# Patient Record
Sex: Male | Born: 1955
Health system: Southern US, Community
[De-identification: ages and names within clinical notes are randomized; demographics above are authoritative.]

## PROBLEM LIST (undated history)

## (undated) DIAGNOSIS — K429 Umbilical hernia without obstruction or gangrene: Secondary | ICD-10-CM

## (undated) DIAGNOSIS — I2510 Atherosclerotic heart disease of native coronary artery without angina pectoris: Secondary | ICD-10-CM

## (undated) DIAGNOSIS — I10 Essential (primary) hypertension: Secondary | ICD-10-CM

## (undated) DIAGNOSIS — M1990 Unspecified osteoarthritis, unspecified site: Secondary | ICD-10-CM

## (undated) DIAGNOSIS — E785 Hyperlipidemia, unspecified: Secondary | ICD-10-CM

## (undated) HISTORY — DX: Hyperlipidemia, unspecified: E78.5

## (undated) HISTORY — DX: Umbilical hernia without obstruction or gangrene: K42.9

## (undated) HISTORY — PX: COLONOSCOPY: SHX174

## (undated) HISTORY — PX: KNEE SURGERY: SHX244

## (undated) HISTORY — DX: Essential (primary) hypertension: I10

## (undated) HISTORY — DX: Atherosclerotic heart disease of native coronary artery without angina pectoris: I25.10

---

## 2011-06-24 ENCOUNTER — Ambulatory Visit (INDEPENDENT_AMBULATORY_CARE_PROVIDER_SITE_OTHER): Payer: BC Managed Care – PPO

## 2011-06-24 DIAGNOSIS — R05 Cough: Secondary | ICD-10-CM

## 2011-06-24 DIAGNOSIS — J029 Acute pharyngitis, unspecified: Secondary | ICD-10-CM

## 2011-06-24 DIAGNOSIS — R059 Cough, unspecified: Secondary | ICD-10-CM

## 2011-06-24 DIAGNOSIS — J019 Acute sinusitis, unspecified: Secondary | ICD-10-CM

## 2015-03-25 ENCOUNTER — Encounter (HOSPITAL_COMMUNITY): Payer: Self-pay | Admitting: Emergency Medicine

## 2015-03-25 ENCOUNTER — Emergency Department (HOSPITAL_COMMUNITY): Payer: BLUE CROSS/BLUE SHIELD

## 2015-03-25 ENCOUNTER — Emergency Department (HOSPITAL_COMMUNITY)
Admission: EM | Admit: 2015-03-25 | Discharge: 2015-03-25 | Disposition: A | Discharge status: Home / Self Care | Payer: BLUE CROSS/BLUE SHIELD | Attending: Emergency Medicine | Admitting: Emergency Medicine

## 2015-03-25 DIAGNOSIS — Z79899 Other long term (current) drug therapy: Secondary | ICD-10-CM | POA: Diagnosis not present

## 2015-03-25 DIAGNOSIS — R109 Unspecified abdominal pain: Secondary | ICD-10-CM

## 2015-03-25 DIAGNOSIS — N200 Calculus of kidney: Secondary | ICD-10-CM | POA: Insufficient documentation

## 2015-03-25 LAB — CBC WITH DIFFERENTIAL/PLATELET
Basophils Absolute: 0 10*3/uL (ref 0.0–0.1)
Basophils Relative: 0 %
EOS ABS: 0.2 10*3/uL (ref 0.0–0.7)
EOS PCT: 3 %
HCT: 46.3 % (ref 39.0–52.0)
HEMOGLOBIN: 16.3 g/dL (ref 13.0–17.0)
LYMPHS ABS: 2.1 10*3/uL (ref 0.7–4.0)
LYMPHS PCT: 31 %
MCH: 33.1 pg (ref 26.0–34.0)
MCHC: 35.2 g/dL (ref 30.0–36.0)
MCV: 94.1 fL (ref 78.0–100.0)
MONOS PCT: 7 %
Monocytes Absolute: 0.5 10*3/uL (ref 0.1–1.0)
NEUTROS PCT: 59 %
Neutro Abs: 4 10*3/uL (ref 1.7–7.7)
Platelets: 242 10*3/uL (ref 150–400)
RBC: 4.92 MIL/uL (ref 4.22–5.81)
RDW: 12.5 % (ref 11.5–15.5)
WBC: 6.8 10*3/uL (ref 4.0–10.5)

## 2015-03-25 LAB — COMPREHENSIVE METABOLIC PANEL
ALK PHOS: 59 U/L (ref 38–126)
ALT: 31 U/L (ref 17–63)
ANION GAP: 10 (ref 5–15)
AST: 40 U/L (ref 15–41)
Albumin: 4.5 g/dL (ref 3.5–5.0)
BUN: 21 mg/dL — ABNORMAL HIGH (ref 6–20)
CALCIUM: 9.7 mg/dL (ref 8.9–10.3)
CO2: 22 mmol/L (ref 22–32)
CREATININE: 1.06 mg/dL (ref 0.61–1.24)
Chloride: 109 mmol/L (ref 101–111)
Glucose, Bld: 103 mg/dL — ABNORMAL HIGH (ref 65–99)
Potassium: 4 mmol/L (ref 3.5–5.1)
SODIUM: 141 mmol/L (ref 135–145)
TOTAL PROTEIN: 7.9 g/dL (ref 6.5–8.1)
Total Bilirubin: 0.9 mg/dL (ref 0.3–1.2)

## 2015-03-25 LAB — URINALYSIS, ROUTINE W REFLEX MICROSCOPIC
BILIRUBIN URINE: NEGATIVE
Glucose, UA: NEGATIVE mg/dL
KETONES UR: NEGATIVE mg/dL
Leukocytes, UA: NEGATIVE
NITRITE: NEGATIVE
PROTEIN: 30 mg/dL — AB
SPECIFIC GRAVITY, URINE: 1.027 (ref 1.005–1.030)
UROBILINOGEN UA: 0.2 mg/dL (ref 0.0–1.0)
pH: 5.5 (ref 5.0–8.0)

## 2015-03-25 LAB — URINE MICROSCOPIC-ADD ON

## 2015-03-25 MED ORDER — HYDROCODONE-ACETAMINOPHEN 5-325 MG PO TABS
2.0000 | ORAL_TABLET | ORAL | Status: DC | PRN
Start: 2015-03-25 — End: 2021-08-13

## 2015-03-25 MED ORDER — HYDROMORPHONE HCL 1 MG/ML IJ SOLN
1.0000 mg | Freq: Once | INTRAMUSCULAR | Status: AC
Start: 2015-03-25 — End: 2015-03-25
  Administered 2015-03-25: 1 mg via INTRAVENOUS
  Filled 2015-03-25: qty 1

## 2015-03-25 MED ORDER — HYDROMORPHONE HCL 1 MG/ML IJ SOLN
1.0000 mg | Freq: Once | INTRAMUSCULAR | Status: AC
  Administered 2015-03-25: 1 mg via INTRAVENOUS
  Filled 2015-03-25: qty 1

## 2015-03-25 MED ORDER — TAMSULOSIN HCL 0.4 MG PO CAPS: 0.4000 mg | ORAL_CAPSULE | Freq: Every day | ORAL | Status: DC

## 2015-03-25 MED ORDER — NAPROXEN 500 MG PO TABS: 500.0000 mg | ORAL_TABLET | Freq: Three times a day (TID) | ORAL | Status: DC

## 2015-03-25 MED ORDER — KETOROLAC TROMETHAMINE 30 MG/ML IJ SOLN
30.0000 mg | Freq: Once | INTRAMUSCULAR | Status: DC
  Filled 2015-03-25: qty 1

## 2015-03-25 MED ORDER — KETOROLAC TROMETHAMINE 30 MG/ML IJ SOLN
30.0000 mg | Freq: Once | INTRAMUSCULAR | Status: AC
  Administered 2015-03-25: 30 mg via INTRAVENOUS

## 2015-03-25 MED ORDER — ONDANSETRON 4 MG PO TBDP: 4.0000 mg | ORAL_TABLET | Freq: Three times a day (TID) | ORAL | Status: DC | PRN

## 2015-03-25 MED ORDER — ONDANSETRON HCL 4 MG/2ML IJ SOLN
4.0000 mg | Freq: Once | INTRAMUSCULAR | Status: AC
  Administered 2015-03-25: 4 mg via INTRAVENOUS
  Filled 2015-03-25: qty 2

## 2015-03-25 MED ORDER — SODIUM CHLORIDE 0.9 % IV BOLUS (SEPSIS)
1000.0000 mL | Freq: Once | INTRAVENOUS | Status: AC
  Administered 2015-03-25: 1000 mL via INTRAVENOUS

## 2015-03-25 NOTE — ED Provider Notes (Signed)
CSN: [id number]     Arrival date & time 03/25/15  1748 History   First MD Initiated Contact with Patient 03/25/15 1806     Chief Complaint  Patient presents with  . Abdominal Pain     (Consider location/radiation/quality/duration/timing/severity/associated sxs/prior Treatment) Patient is a 59 y.o. male presenting with flank pain.  Flank Pain This is a new problem. The current episode started 3 to 5 hours ago. The problem occurs constantly. The problem has not changed since onset.Associated symptoms include abdominal pain. Pertinent negatives include no chest pain, no headaches and no shortness of breath. Nothing aggravates the symptoms. Nothing relieves the symptoms. He has tried nothing for the symptoms. The treatment provided no relief.    History reviewed. No pertinent past medical history. History reviewed. No pertinent past surgical history. No family history on file. Social History  Substance Use Topics  . Smoking status: Never Smoker   . Smokeless tobacco: None  . Alcohol Use: No    Review of Systems  Constitutional: Negative for fever.  HENT: Negative for sore throat.   Eyes: Negative for visual disturbance.  Respiratory: Negative for shortness of breath.   Cardiovascular: Negative for chest pain.  Gastrointestinal: Positive for nausea and abdominal pain. Negative for vomiting, diarrhea and constipation.  Genitourinary: Positive for flank pain. Negative for difficulty urinating.  Musculoskeletal: Negative for back pain and neck stiffness.  Skin: Negative for rash.  Neurological: Negative for syncope and headaches.      Allergies  Review of patient's allergies indicates no known allergies.  Home Medications   Prior to Admission medications   Medication Sig Start Date End Date Taking? Authorizing Provider  calcium carbonate (TUMS - DOSED IN MG ELEMENTAL CALCIUM) 500 MG chewable tablet Chew 1 tablet by mouth 2 (two) times daily as needed for indigestion or  heartburn.   Yes Historical Provider, MD  HYDROcodone-acetaminophen (NORCO/VICODIN) 5-325 MG tablet Take 2 tablets by mouth every 4 (four) hours as needed. 03/25/15   Alvira Monday, MD  naproxen (NAPROSYN) 500 MG tablet Take 1 tablet (500 mg total) by mouth 3 (three) times daily with meals. 03/25/15   Alvira Monday, MD  ondansetron (ZOFRAN ODT) 4 MG disintegrating tablet Take 1 tablet (4 mg total) by mouth every 8 (eight) hours as needed for nausea or vomiting. 03/25/15   Alvira Monday, MD  tamsulosin (FLOMAX) 0.4 MG CAPS capsule Take 1 capsule (0.4 mg total) by mouth daily. 03/25/15   Alvira Monday, MD   BP 122/66 mmHg  Pulse 68  Temp(Src) 98.9 F (37.2 C) (Oral)  Resp 18  SpO2 96% Physical Exam  Constitutional: He is oriented to person, place, and time. He appears well-developed and well-nourished. He appears distressed (pain).  HENT:  Head: Normocephalic and atraumatic.  Eyes: Conjunctivae and EOM are normal.  Neck: Normal range of motion.  Cardiovascular: Normal rate, regular rhythm, normal heart sounds and intact distal pulses.  Exam reveals no gallop and no friction rub.   No murmur heard. Pulmonary/Chest: Effort normal and breath sounds normal. No respiratory distress. He has no wheezes. He has no rales.  Abdominal: Soft. He exhibits no distension. There is tenderness. There is CVA tenderness. There is no guarding, no tenderness at McBurney's point and negative Murphy's sign.  Musculoskeletal: He exhibits no edema.  Neurological: He is alert and oriented to person, place, and time.  Skin: Skin is warm and dry. He is not diaphoretic.  Nursing note and vitals reviewed.   ED Course  Procedures (including critical  care time) Labs Review Labs Reviewed  COMPREHENSIVE METABOLIC PANEL - Abnormal; Notable for the following:    Glucose, Bld 103 (*)    BUN 21 (*)    All other components within normal limits  URINALYSIS, ROUTINE W REFLEX MICROSCOPIC (NOT AT [facility]) - Abnormal;  Notable for the following:    Color, Urine AMBER (*)    APPearance TURBID (*)    Hgb urine dipstick LARGE (*)    Protein, ur 30 (*)    All other components within normal limits  URINE MICROSCOPIC-ADD ON - Abnormal; Notable for the following:    Bacteria, UA FEW (*)    Crystals CA OXALATE CRYSTALS (*)    All other components within normal limits  URINE CULTURE  CBC WITH DIFFERENTIAL/PLATELET    Imaging Review Ct Renal Stone Study  03/25/2015   CLINICAL DATA:  Patient with severe abdominal pain within the right flank.  EXAM: CT ABDOMEN AND PELVIS WITHOUT CONTRAST  TECHNIQUE: Multidetector CT imaging of the abdomen and pelvis was performed following the standard protocol without IV contrast.  COMPARISON:  None.  FINDINGS: Lower chest: Normal heart size. No consolidative or nodular pulmonary opacities. No pleural effusion.  Hepatobiliary: Multiple low-attenuation lesions are demonstrated within the liver, too small to characterize however statistically likely represent small cysts. There a few scattered calcifications throughout the liver. Calcified stones layering within the gallbladder lumen. No gallbladder wall thickening. No intrahepatic or extrahepatic biliary ductal dilatation.  Pancreas: Unremarkable  Spleen:  Unremarkable.  Adrenals/Urinary Tract: The adrenal glands are normal. There is a 4 mm nonobstructing stone within the inferior pole of the left kidney. There is mild right hydroureteronephrosis to the level of the distal ureter (image 72; series 2) with there is an obstructing 4 mm stone. Urinary bladder is decompressed. There is peripheral high attenuation within the urinary bladder (image 81; series 2) which is nonspecific and may represent a passed stone or potentially bladder wall calcification.  Stomach/Bowel: The appendix is normal. No abnormal bowel wall thickening or evidence for bowel obstruction. No free fluid or free intraperitoneal air.  Vascular/Lymphatic: Normal caliber  abdominal aorta. No retroperitoneal lymphadenopathy.  Other: Fat containing periumbilical hernia.  Musculoskeletal: No aggressive or acute appearing osseous lesions. Degenerative changes most pronounced L2-3. Additionally there are degenerative changes at T11-12.  IMPRESSION: Obstructing 4 mm stone within the distal right ureter resulting in mild right hydroureteronephrosis.  Nonobstructing 4 mm stone inferior pole left kidney.  Thin high attenuation dependently within the urinary bladder is nonspecific and may represent passed stones or potentially bladder wall calcifications.  Cholelithiasis.   Electronically Signed   By: Annia Belt M.D.   On: 03/25/2015 19:01   I have personally reviewed and evaluated these images and lab results as part of my medical decision-making.   EKG Interpretation None      MDM   Final diagnoses:  Right flank pain  Nephrolithiasis, 4mm right ureteral   60yo male with no significant medical hx presents with concern for right flank pain.  DDx includes perforated viscous, nephrolithiasis, pyelonephritis, cholecystitis.  History, exam, labs, and imaging including CT noncontrast consistent with nephrolithiasis.    patient with obstructing 4 mm stone in the distal right ureter. Was given Toradol, Dilaudid and Zofran for pain with resolution. Given prescription for Flomax, naproxen, Zofran and Norco. Given number for urology follow-up. Discussed need for hydration. Patient discharged in stable condition with understanding of reasons to return.      Alvira Monday, MD 03/27/15 [phone]

## 2015-03-25 NOTE — ED Notes (Signed)
Pt c/o severe abd pain that started suddenly. Pt denies n/v/d, no PMH kidney stones, pt does have appendix.

## 2015-03-25 NOTE — ED Notes (Signed)
Pt is aware of need for urine specimen. 

## 2015-03-27 LAB — URINE CULTURE: Culture: 3000

## 2019-05-19 ENCOUNTER — Other Ambulatory Visit: Payer: Self-pay

## 2019-05-19 DIAGNOSIS — Z20822 Contact with and (suspected) exposure to covid-19: Secondary | ICD-10-CM

## 2019-05-23 LAB — NOVEL CORONAVIRUS, NAA: SARS-CoV-2, NAA: NOT DETECTED

## 2020-11-13 DIAGNOSIS — E785 Hyperlipidemia, unspecified: Secondary | ICD-10-CM | POA: Diagnosis not present

## 2020-11-13 DIAGNOSIS — E7801 Familial hypercholesterolemia: Secondary | ICD-10-CM | POA: Diagnosis not present

## 2020-11-13 DIAGNOSIS — Z125 Encounter for screening for malignant neoplasm of prostate: Secondary | ICD-10-CM | POA: Diagnosis not present

## 2020-11-15 DIAGNOSIS — Z23 Encounter for immunization: Secondary | ICD-10-CM | POA: Diagnosis not present

## 2020-11-15 DIAGNOSIS — L578 Other skin changes due to chronic exposure to nonionizing radiation: Secondary | ICD-10-CM | POA: Diagnosis not present

## 2020-11-15 DIAGNOSIS — Z0000 Encounter for general adult medical examination without abnormal findings: Secondary | ICD-10-CM | POA: Diagnosis not present

## 2020-11-15 DIAGNOSIS — E7800 Pure hypercholesterolemia, unspecified: Secondary | ICD-10-CM | POA: Diagnosis not present

## 2020-11-15 DIAGNOSIS — Z86010 Personal history of colonic polyps: Secondary | ICD-10-CM | POA: Diagnosis not present

## 2020-11-15 DIAGNOSIS — J309 Allergic rhinitis, unspecified: Secondary | ICD-10-CM | POA: Diagnosis not present

## 2020-11-15 DIAGNOSIS — Z6828 Body mass index (BMI) 28.0-28.9, adult: Secondary | ICD-10-CM | POA: Diagnosis not present

## 2020-11-15 DIAGNOSIS — N200 Calculus of kidney: Secondary | ICD-10-CM | POA: Diagnosis not present

## 2020-11-19 ENCOUNTER — Other Ambulatory Visit: Payer: Self-pay | Admitting: Internal Medicine

## 2020-11-20 DIAGNOSIS — K439 Ventral hernia without obstruction or gangrene: Secondary | ICD-10-CM | POA: Diagnosis not present

## 2020-11-20 DIAGNOSIS — Z1211 Encounter for screening for malignant neoplasm of colon: Secondary | ICD-10-CM | POA: Diagnosis not present

## 2020-11-20 DIAGNOSIS — Z800 Family history of malignant neoplasm of digestive organs: Secondary | ICD-10-CM | POA: Diagnosis not present

## 2020-11-20 DIAGNOSIS — K429 Umbilical hernia without obstruction or gangrene: Secondary | ICD-10-CM | POA: Diagnosis not present

## 2020-11-21 ENCOUNTER — Other Ambulatory Visit: Payer: Self-pay | Admitting: Internal Medicine

## 2020-11-21 DIAGNOSIS — Z0000 Encounter for general adult medical examination without abnormal findings: Secondary | ICD-10-CM

## 2020-11-21 DIAGNOSIS — E7800 Pure hypercholesterolemia, unspecified: Secondary | ICD-10-CM

## 2020-12-14 ENCOUNTER — Ambulatory Visit
Admission: RE | Admit: 2020-12-14 | Discharge: 2020-12-14 | Disposition: A | Discharge status: Home / Self Care | Payer: BLUE CROSS/BLUE SHIELD | Source: Ambulatory Visit | Attending: Internal Medicine | Admitting: Internal Medicine

## 2020-12-14 DIAGNOSIS — Z0000 Encounter for general adult medical examination without abnormal findings: Secondary | ICD-10-CM

## 2020-12-14 DIAGNOSIS — E7800 Pure hypercholesterolemia, unspecified: Secondary | ICD-10-CM

## 2020-12-25 DIAGNOSIS — I872 Venous insufficiency (chronic) (peripheral): Secondary | ICD-10-CM | POA: Diagnosis not present

## 2020-12-25 DIAGNOSIS — L570 Actinic keratosis: Secondary | ICD-10-CM | POA: Diagnosis not present

## 2020-12-25 DIAGNOSIS — X32XXXA Exposure to sunlight, initial encounter: Secondary | ICD-10-CM | POA: Diagnosis not present

## 2020-12-25 DIAGNOSIS — L821 Other seborrheic keratosis: Secondary | ICD-10-CM | POA: Diagnosis not present

## 2020-12-31 DIAGNOSIS — Z800 Family history of malignant neoplasm of digestive organs: Secondary | ICD-10-CM | POA: Diagnosis not present

## 2020-12-31 DIAGNOSIS — Z1211 Encounter for screening for malignant neoplasm of colon: Secondary | ICD-10-CM | POA: Diagnosis not present

## 2021-04-18 NOTE — Progress Notes (Signed)
Referring-Walter Renne Crigler, MD Reason for referral-coronary artery disease  HPI: 65 year old male for evaluation of coronary artery disease at request of Merri Brunette, MD.  Calcium score July 2022 673 (LAD 563) which was 87 percentile. LDL 5/22 188.  Patient denies dyspnea on exertion, orthopnea, PND, pedal edema, chest pain, palpitations or syncope.  He has no claudication.  Cardiology now asked to evaluate.  Current Outpatient Medications  Medication Sig Dispense Refill   calcium carbonate (TUMS - DOSED IN MG ELEMENTAL CALCIUM) 500 MG chewable tablet Chew 1 tablet by mouth 2 (two) times daily as needed for indigestion or heartburn. (Patient not taking: Reported on 04/29/2021)     HYDROcodone-acetaminophen (NORCO/VICODIN) 5-325 MG tablet Take 2 tablets by mouth every 4 (four) hours as needed. (Patient not taking: Reported on 04/29/2021) 10 tablet 0   naproxen (NAPROSYN) 500 MG tablet Take 1 tablet (500 mg total) by mouth 3 (three) times daily with meals. (Patient not taking: Reported on 04/29/2021) 30 tablet 0   ondansetron (ZOFRAN ODT) 4 MG disintegrating tablet Take 1 tablet (4 mg total) by mouth every 8 (eight) hours as needed for nausea or vomiting. (Patient not taking: Reported on 04/29/2021) 16 tablet 0   No current facility-administered medications for this visit.    No Known Allergies   Past Medical History:  Diagnosis Date   CAD (coronary artery disease)    Hernia, umbilical    Hyperlipidemia    Hypertension     Past Surgical History:  Procedure Laterality Date   KNEE SURGERY      Social History   Socioeconomic History   Marital status: Married    Spouse name: Not on file   Number of children: 2   Years of education: Not on file   Highest education level: Not on file  Occupational History   Not on file  Tobacco Use   Smoking status: Never   Smokeless tobacco: Not on file  Substance and Sexual Activity   Alcohol use: No   Drug use: Not on file   Sexual  activity: Not on file  Other Topics Concern   Not on file  Social History Narrative   Not on file   Social Determinants of Health   Financial Resource Strain: Not on file  Food Insecurity: Not on file  Transportation Needs: Not on file  Physical Activity: Not on file  Stress: Not on file  Social Connections: Not on file  Intimate Partner Violence: Not on file    Family History  Problem Relation Age of Onset   Aortic aneurysm Father     ROS: no fevers or chills, productive cough, hemoptysis, dysphasia, odynophagia, melena, hematochezia, dysuria, hematuria, rash, seizure activity, orthopnea, PND, pedal edema, claudication. Remaining systems are negative.  Physical Exam:   Blood pressure (!) 160/95, pulse (!) 101, height 5' 11.05" (1.805 m), weight 232 lb (105.2 kg), SpO2 99 %.  General:  Well developed/well nourished in NAD Skin warm/dry Patient not depressed No peripheral clubbing Back-normal HEENT-normal/normal eyelids Neck supple/normal carotid upstroke bilaterally; no bruits; no JVD; no thyromegaly chest - CTA/ normal expansion CV - RRR/normal S1 and S2; no murmurs, rubs or gallops;  PMI nondisplaced Abdomen -NT/ND, no HSM, no mass, + bowel sounds, positive bruit 2+ femoral pulses, no bruits Ext-no edema, chords, 2+ DP Neuro-grossly nonfocal  ECG -sinus tachycardia at a rate of 101, right axis deviation, no ST changes.  Personally reviewed  A/P  1 coronary artery disease-patient with elevated calcium score particularly in  the LAD.  Patient denies chest pain.  Plan to treat medically with aspirin 81 mg daily and statin.  I will arrange a stress nuclear study to screen for ischemia particularly in light of significant elevation in calcium score in LAD.  We also discussed lifestyle modification.  He is exercising and following a diet.  He does not smoke.  2 hyperlipidemia-last LDL 188.  Begin Crestor 40 mg daily.  Check lipids and liver in 8 weeks.  3 elevated blood  pressure reading-patient's blood pressure is elevated today.  He has a blood pressure cuff at home and have asked him to bring this to correlate with ours.  If he consistently runs higher than systolic blood pressure of [id number] or diastolic of 85 we will add antihypertensive.  4 family history of abdominal aortic aneurysm/bruit-schedule abdominal ultrasound to exclude aneurysm.  Kirk Ruths, MD

## 2021-04-29 ENCOUNTER — Encounter: Payer: Self-pay | Admitting: Cardiology

## 2021-04-29 ENCOUNTER — Other Ambulatory Visit: Payer: Self-pay

## 2021-04-29 ENCOUNTER — Ambulatory Visit (INDEPENDENT_AMBULATORY_CARE_PROVIDER_SITE_OTHER): Payer: Medicare HMO | Admitting: Cardiology

## 2021-04-29 VITALS — BP 160/95 | HR 101 | Ht 71.05 in | Wt 232.0 lb

## 2021-04-29 DIAGNOSIS — I2510 Atherosclerotic heart disease of native coronary artery without angina pectoris: Secondary | ICD-10-CM

## 2021-04-29 DIAGNOSIS — R0989 Other specified symptoms and signs involving the circulatory and respiratory systems: Secondary | ICD-10-CM | POA: Diagnosis not present

## 2021-04-29 DIAGNOSIS — E7800 Pure hypercholesterolemia, unspecified: Secondary | ICD-10-CM

## 2021-04-29 MED ORDER — ASPIRIN EC 81 MG PO TBEC: 81.0000 mg | DELAYED_RELEASE_TABLET | Freq: Every day | ORAL | 3 refills | Status: AC

## 2021-04-29 MED ORDER — ROSUVASTATIN CALCIUM 40 MG PO TABS: 40.0000 mg | ORAL_TABLET | Freq: Every day | ORAL | 3 refills | Status: DC

## 2021-04-29 NOTE — Addendum Note (Signed)
Addended by: Freddi Starr on: 04/29/2021 08:58 AM   Modules accepted: Orders

## 2021-04-29 NOTE — Patient Instructions (Signed)
Medication Instructions:   START ASPIRIN 81 MG ONCE DAILY  START ROSUVASTATIN 40 MG ONCE DAILY  *If you need a refill on your cardiac medications before your next appointment, please call your pharmacy*   Lab Work:  Your physician recommends that you return for lab work in: [facility]  If you have labs (blood work) drawn today and your tests are completely normal, you will receive your results only by: MyChart Message (if you have MyChart) OR A paper copy in the mail If you have any lab test that is abnormal or we need to change your treatment, we will call you to review the results.   Testing/Procedures:  Your physician has requested that you have a lexiscan myoview. For further information please visit [email or web address] Please follow instruction sheet, as given. [address] [facility]  Your physician has requested that you have an abdominal aorta duplex. During this test, an ultrasound is used to evaluate the aorta. Allow 30 minutes for this exam. Do not eat after midnight the day before and avoid carbonated beverages [facility]   Follow-Up: At [facility], you and your health needs are our priority.  As part of our continuing mission to provide you with exceptional heart care, we have created designated Provider Care Teams.  These Care Teams include your primary Cardiologist (physician) and Advanced Practice Providers (APPs -  Physician Assistants and Nurse Practitioners) who all work together to provide you with the care you need, when you need it.  We recommend signing up for the patient portal called "MyChart".  Sign up information is provided on this After Visit Summary.  MyChart is used to connect with patients for Virtual Visits (Telemedicine).  Patients are able to view lab/test results, encounter notes, upcoming appointments, etc.  Non-urgent messages can be sent to your provider as well.   To learn more about what you can do with MyChart, go to  [email or web address].    Your next appointment:   3 month(s)  The format for your next appointment:   In Person  Provider:   Olga Millers MD

## 2021-04-30 ENCOUNTER — Telehealth (HOSPITAL_COMMUNITY): Payer: Self-pay | Admitting: *Deleted

## 2021-04-30 NOTE — Telephone Encounter (Signed)
Patient given detailed instructions per Myocardial Perfusion Study Information Sheet for the test on 05/06/21 at 10:45. Patient notified to arrive 15 minutes early and that it is imperative to arrive on time for appointment to keep from having the test rescheduled.  If you need to cancel or reschedule your appointment, please call the office within 24 hours of your appointment. . Patient verbalized understanding.Christopher Whitaker

## 2021-05-02 DIAGNOSIS — I2510 Atherosclerotic heart disease of native coronary artery without angina pectoris: Secondary | ICD-10-CM | POA: Diagnosis not present

## 2021-05-02 DIAGNOSIS — E7800 Pure hypercholesterolemia, unspecified: Secondary | ICD-10-CM | POA: Diagnosis not present

## 2021-05-02 LAB — LIPID PANEL
Chol/HDL Ratio: 5.9 ratio — ABNORMAL HIGH (ref 0.0–5.0)
Cholesterol, Total: 194 mg/dL (ref 100–199)
HDL: 33 mg/dL — ABNORMAL LOW (ref >39)
LDL Chol Calc (NIH): 131 mg/dL — ABNORMAL HIGH (ref 0–99)
Triglycerides: 164 mg/dL — ABNORMAL HIGH (ref 0–149)
VLDL Cholesterol Cal: 30 mg/dL (ref 5–40)

## 2021-05-02 LAB — HEPATIC FUNCTION PANEL
ALT: 20 IU/L (ref 0–44)
AST: 24 IU/L (ref 0–40)
Albumin: 4.6 g/dL (ref 3.8–4.8)
Alkaline Phosphatase: 62 IU/L (ref 44–121)
Bilirubin Total: 0.7 mg/dL (ref 0.0–1.2)
Bilirubin, Direct: 0.2 mg/dL (ref 0.00–0.40)
Total Protein: 7.1 g/dL (ref 6.0–8.5)

## 2021-05-06 ENCOUNTER — Other Ambulatory Visit: Payer: Self-pay

## 2021-05-06 ENCOUNTER — Ambulatory Visit (HOSPITAL_COMMUNITY): Payer: Medicare HMO | Attending: Cardiology

## 2021-05-06 DIAGNOSIS — I2510 Atherosclerotic heart disease of native coronary artery without angina pectoris: Secondary | ICD-10-CM | POA: Diagnosis not present

## 2021-05-06 LAB — MYOCARDIAL PERFUSION IMAGING
Estimated workload: 8.5
Exercise duration (min): 7 min
Exercise duration (sec): 0 s
LV dias vol: 71 mL (ref 62–150)
LV sys vol: 34 mL
MPHR: 154 {beats}/min
Nuc Stress EF: 52 %
Peak HR: 146 {beats}/min
Percent HR: 94 %
Rest BP: 81 mmHg
Rest HR: 1.5 {beats}/min
Rest Nuclear Isotope Dose: 8.8 mCi
SDS: 0
SRS: 0
SSS: 0
ST Depression (mm): 0 mm
Stress Nuclear Isotope Dose: 26.1 mCi
TID: 0.91

## 2021-05-06 MED ORDER — TECHNETIUM TC 99M TETROFOSMIN IV KIT
26.1000 | PACK | Freq: Once | INTRAVENOUS | Status: AC | PRN
  Administered 2021-05-06: 26.1 via INTRAVENOUS
  Filled 2021-05-06: qty 27

## 2021-05-06 MED ORDER — TECHNETIUM TC 99M TETROFOSMIN IV KIT
8.8000 | PACK | Freq: Once | INTRAVENOUS | Status: AC | PRN
  Administered 2021-05-06: 8.8 via INTRAVENOUS
  Filled 2021-05-06: qty 9

## 2021-05-13 ENCOUNTER — Encounter: Payer: Self-pay | Admitting: *Deleted

## 2021-05-17 ENCOUNTER — Other Ambulatory Visit: Payer: Self-pay

## 2021-05-17 ENCOUNTER — Ambulatory Visit (HOSPITAL_COMMUNITY)
Admission: RE | Admit: 2021-05-17 | Discharge: 2021-05-17 | Disposition: A | Discharge status: Home / Self Care | Payer: Medicare HMO | Source: Ambulatory Visit | Attending: Cardiovascular Disease | Admitting: Cardiovascular Disease

## 2021-05-17 DIAGNOSIS — R0989 Other specified symptoms and signs involving the circulatory and respiratory systems: Secondary | ICD-10-CM | POA: Diagnosis not present

## 2021-05-23 ENCOUNTER — Encounter: Payer: Self-pay | Admitting: *Deleted

## 2021-08-07 NOTE — Progress Notes (Signed)
HPI: Follow-up coronary artery disease. Calcium score July 2022 673 (LAD 563) which was 87 percentile.  Nuclear study November 2022 showed no ischemia and ejection fraction 52%.  Abdominal ultrasound December 2022 showed ectatic abdominal aorta at 2.8 cm and left common iliac of 1.7 cm.  Since last seen there is no dyspnea on exertion, orthopnea, PND, pedal edema, chest pain or syncope.  Current Outpatient Medications  Medication Sig Dispense Refill   aspirin EC 81 MG tablet Take 1 tablet (81 mg total) by mouth daily. Swallow whole. 90 tablet 3   calcium carbonate (TUMS - DOSED IN MG ELEMENTAL CALCIUM) 500 MG chewable tablet Chew 1 tablet by mouth 2 (two) times daily as needed for indigestion or heartburn.     naproxen (NAPROSYN) 500 MG tablet Take 1 tablet (500 mg total) by mouth 3 (three) times daily with meals. (Patient taking differently: Take 500 mg by mouth as needed.) 30 tablet 0   rosuvastatin (CRESTOR) 40 MG tablet Take 1 tablet (40 mg total) by mouth daily. 90 tablet 3   No current facility-administered medications for this visit.     Past Medical History:  Diagnosis Date   CAD (coronary artery disease)    Hernia, umbilical    Hyperlipidemia    Hypertension     Past Surgical History:  Procedure Laterality Date   KNEE SURGERY      Social History   Socioeconomic History   Marital status: Married    Spouse name: Not on file   Number of children: 2   Years of education: Not on file   Highest education level: Not on file  Occupational History   Not on file  Tobacco Use   Smoking status: Never   Smokeless tobacco: Not on file  Substance and Sexual Activity   Alcohol use: No   Drug use: Not on file   Sexual activity: Not on file  Other Topics Concern   Not on file  Social History Narrative   Not on file   Social Determinants of Health   Financial Resource Strain: Not on file  Food Insecurity: Not on file  Transportation Needs: Not on file  Physical  Activity: Not on file  Stress: Not on file  Social Connections: Not on file  Intimate Partner Violence: Not on file    Family History  Problem Relation Age of Onset   Aortic aneurysm Father     ROS: no fevers or chills, productive cough, hemoptysis, dysphasia, odynophagia, melena, hematochezia, dysuria, hematuria, rash, seizure activity, orthopnea, PND, pedal edema, claudication. Remaining systems are negative.  Physical Exam: Well-developed well-nourished in no acute distress.  Skin is warm and dry.  HEENT is normal.  Neck is supple.  Chest is clear to auscultation with normal expansion.  Cardiovascular exam is regular rate and rhythm.  Abdominal exam nontender or distended. No masses palpated. Extremities show no edema. neuro grossly intact  A/P  1 coronary artery disease-patient denies chest pain.  Previous nuclear study showed no ischemia.  Plan medical therapy.  We will continue aspirin 81 mg daily and statin.  2 hyperlipidemia-we will continue Crestor at present dose.  Check lipids and liver.  Goal LDL less than 50.  3 ectatic abdominal aorta/iliac artery-patient will need follow-up ultrasound December 2024.  4 history of elevated blood pressure reading-patient checks his blood pressure at home and there are times when it is controlled.  He will continue to follow.  Goal systolic blood pressure less than 130  and diastolic less than 85.  He will message [place] if it is higher than this and we will add an ARB.  Kirk Ruths, MD

## 2021-08-13 ENCOUNTER — Encounter: Payer: Self-pay | Admitting: Cardiology

## 2021-08-13 ENCOUNTER — Other Ambulatory Visit: Payer: Self-pay

## 2021-08-13 ENCOUNTER — Ambulatory Visit: Payer: Medicare HMO | Admitting: Cardiology

## 2021-08-13 VITALS — BP 146/84 | HR 88 | Ht 71.0 in | Wt 242.8 lb

## 2021-08-13 DIAGNOSIS — R030 Elevated blood-pressure reading, without diagnosis of hypertension: Secondary | ICD-10-CM

## 2021-08-13 DIAGNOSIS — E7800 Pure hypercholesterolemia, unspecified: Secondary | ICD-10-CM

## 2021-08-13 DIAGNOSIS — I2510 Atherosclerotic heart disease of native coronary artery without angina pectoris: Secondary | ICD-10-CM | POA: Diagnosis not present

## 2021-08-13 DIAGNOSIS — I714 Abdominal aortic aneurysm, without rupture, unspecified: Secondary | ICD-10-CM

## 2021-08-13 NOTE — Patient Instructions (Signed)
°  Lab Work: ° °Your physician recommends that you return for lab work FASTING ° °If you have labs (blood work) drawn today and your tests are completely normal, you will receive your results only by: °MyChart Message (if you have MyChart) OR °A paper copy in the mail °If you have any lab test that is abnormal or we need to change your treatment, we will call you to review the results. ° ° °Follow-Up: °At [facility], you and your health needs are our priority.  As part of our continuing mission to provide you with exceptional heart care, we have created designated Provider Care Teams.  These Care Teams include your primary Cardiologist (physician) and Advanced Practice Providers (APPs -  Physician Assistants and Nurse Practitioners) who all work together to provide you with the care you need, when you need it. ° °We recommend signing up for the patient portal called "MyChart".  Sign up information is provided on this After Visit Summary.  MyChart is used to connect with patients for Virtual Visits (Telemedicine).  Patients are able to view lab/test results, encounter notes, upcoming appointments, etc.  Non-urgent messages can be sent to your provider as well.   °To learn more about what you can do with MyChart, go to [email or web address].   ° °Your next appointment:   °6 month(s) ° °The format for your next appointment:   °In Person ° °Provider:   °BRIAN CRENSHAW MD  ° ° ° °

## 2021-09-02 ENCOUNTER — Encounter: Payer: Self-pay | Admitting: *Deleted

## 2021-09-13 DIAGNOSIS — I2510 Atherosclerotic heart disease of native coronary artery without angina pectoris: Secondary | ICD-10-CM | POA: Diagnosis not present

## 2021-09-13 DIAGNOSIS — E7800 Pure hypercholesterolemia, unspecified: Secondary | ICD-10-CM | POA: Diagnosis not present

## 2021-09-14 LAB — HEPATIC FUNCTION PANEL
ALT: 32 IU/L (ref 0–44)
AST: 41 IU/L — ABNORMAL HIGH (ref 0–40)
Albumin: 4.8 g/dL (ref 3.8–4.8)
Alkaline Phosphatase: 49 IU/L (ref 44–121)
Bilirubin Total: 0.7 mg/dL (ref 0.0–1.2)
Bilirubin, Direct: 0.2 mg/dL (ref 0.00–0.40)
Total Protein: 7.5 g/dL (ref 6.0–8.5)

## 2021-09-14 LAB — LIPID PANEL
Chol/HDL Ratio: 2.9 ratio (ref 0.0–5.0)
Cholesterol, Total: 147 mg/dL (ref 100–199)
HDL: 51 mg/dL (ref >39)
LDL Chol Calc (NIH): 82 mg/dL (ref 0–99)
Triglycerides: 69 mg/dL (ref 0–149)
VLDL Cholesterol Cal: 14 mg/dL (ref 5–40)

## 2021-09-16 ENCOUNTER — Telehealth: Payer: Self-pay | Admitting: *Deleted

## 2021-09-16 DIAGNOSIS — I2510 Atherosclerotic heart disease of native coronary artery without angina pectoris: Secondary | ICD-10-CM

## 2021-09-16 DIAGNOSIS — E7800 Pure hypercholesterolemia, unspecified: Secondary | ICD-10-CM

## 2021-09-16 NOTE — Telephone Encounter (Signed)
-----   Message from Lewayne Bunting, MD sent at 09/14/2021  6:44 AM EDT ----- ?Add zetia 10 mg daily; lipids and liver 8 weeks ?Olga Millers ? ?

## 2021-09-16 NOTE — Telephone Encounter (Signed)
Spoke with pt, Aware of dr Ludwig Clarks recommendations. He is not interested to start another medication at this time. He would like to continue to work on weight and diet before preceding with another med. Paperwork for lab work mailed to the patient and he will have lab scheduled prior to his follow up appointment in august.  ?

## 2021-11-13 DIAGNOSIS — Z125 Encounter for screening for malignant neoplasm of prostate: Secondary | ICD-10-CM | POA: Diagnosis not present

## 2021-11-13 DIAGNOSIS — E7801 Familial hypercholesterolemia: Secondary | ICD-10-CM | POA: Diagnosis not present

## 2021-11-18 DIAGNOSIS — N200 Calculus of kidney: Secondary | ICD-10-CM | POA: Diagnosis not present

## 2021-11-18 DIAGNOSIS — Z0000 Encounter for general adult medical examination without abnormal findings: Secondary | ICD-10-CM | POA: Diagnosis not present

## 2021-11-18 DIAGNOSIS — E7800 Pure hypercholesterolemia, unspecified: Secondary | ICD-10-CM | POA: Diagnosis not present

## 2021-11-18 DIAGNOSIS — J309 Allergic rhinitis, unspecified: Secondary | ICD-10-CM | POA: Diagnosis not present

## 2021-11-18 DIAGNOSIS — I2510 Atherosclerotic heart disease of native coronary artery without angina pectoris: Secondary | ICD-10-CM | POA: Diagnosis not present

## 2021-12-30 DIAGNOSIS — I2510 Atherosclerotic heart disease of native coronary artery without angina pectoris: Secondary | ICD-10-CM | POA: Diagnosis not present

## 2022-01-28 NOTE — Progress Notes (Unsigned)
    HPI: Follow-up coronary artery disease. Calcium score July 2022 673 (LAD 563) which was 87 percentile. Nuclear study November 2022 showed no ischemia and ejection fraction 52%.  Abdominal ultrasound December 2022 showed ectatic abdominal aorta at 2.8 cm and left common iliac of 1.7 cm.  Since last seen  Current Outpatient Medications  Medication Sig Dispense Refill   aspirin EC 81 MG tablet Take 1 tablet (81 mg total) by mouth daily. Swallow whole. 90 tablet 3   calcium carbonate (TUMS - DOSED IN MG ELEMENTAL CALCIUM) 500 MG chewable tablet Chew 1 tablet by mouth 2 (two) times daily as needed for indigestion or heartburn.     naproxen (NAPROSYN) 500 MG tablet Take 1 tablet (500 mg total) by mouth 3 (three) times daily with meals. (Patient taking differently: Take 500 mg by mouth as needed.) 30 tablet 0   rosuvastatin (CRESTOR) 40 MG tablet Take 1 tablet (40 mg total) by mouth daily. 90 tablet 3   No current facility-administered medications for this visit.     Past Medical History:  Diagnosis Date   CAD (coronary artery disease)    Hernia, umbilical    Hyperlipidemia    Hypertension     Past Surgical History:  Procedure Laterality Date   KNEE SURGERY      Social History   Socioeconomic History   Marital status: Married    Spouse name: Not on file   Number of children: 2   Years of education: Not on file   Highest education level: Not on file  Occupational History   Not on file  Tobacco Use   Smoking status: Never   Smokeless tobacco: Not on file  Substance and Sexual Activity   Alcohol use: No   Drug use: Not on file   Sexual activity: Not on file  Other Topics Concern   Not on file  Social History Narrative   Not on file   Social Determinants of Health   Financial Resource Strain: Not on file  Food Insecurity: Not on file  Transportation Needs: Not on file  Physical Activity: Not on file  Stress: Not on file  Social Connections: Not on file  Intimate  Partner Violence: Not on file    Family History  Problem Relation Age of Onset   Aortic aneurysm Father     ROS: no fevers or chills, productive cough, hemoptysis, dysphasia, odynophagia, melena, hematochezia, dysuria, hematuria, rash, seizure activity, orthopnea, PND, pedal edema, claudication. Remaining systems are negative.  Physical Exam: Well-developed well-nourished in no acute distress.  Skin is warm and dry.  HEENT is normal.  Neck is supple.  Chest is clear to auscultation with normal expansion.  Cardiovascular exam is regular rate and rhythm.  Abdominal exam nontender or distended. No masses palpated. Extremities show no edema. neuro grossly intact  ECG- personally reviewed  A/P  1 coronary artery disease-patient doing well with no chest pain.  Continue aspirin and statin.  2 hyperlipidemia-continue Crestor.  3 ectatic abdominal aorta/iliac artery-plan follow-up ultrasound December 2024.  4 hypertension-  Olga Millers, MD

## 2022-02-11 ENCOUNTER — Ambulatory Visit: Payer: Medicare HMO | Attending: Cardiology | Admitting: Cardiology

## 2022-02-11 ENCOUNTER — Encounter: Payer: Self-pay | Admitting: Cardiology

## 2022-02-11 VITALS — BP 142/86 | HR 88 | Ht 71.0 in | Wt 237.4 lb

## 2022-02-11 DIAGNOSIS — E7800 Pure hypercholesterolemia, unspecified: Secondary | ICD-10-CM | POA: Diagnosis not present

## 2022-02-11 DIAGNOSIS — I2510 Atherosclerotic heart disease of native coronary artery without angina pectoris: Secondary | ICD-10-CM

## 2022-02-11 DIAGNOSIS — R030 Elevated blood-pressure reading, without diagnosis of hypertension: Secondary | ICD-10-CM

## 2022-02-11 NOTE — Patient Instructions (Signed)
  Follow-Up: At [facility], you and your health needs are our priority.  As part of our continuing mission to provide you with exceptional heart care, we have created designated Provider Care Teams.  These Care Teams include your primary Cardiologist (physician) and Advanced Practice Providers (APPs -  Physician Assistants and Nurse Practitioners) who all work together to provide you with the care you need, when you need it.  We recommend signing up for the patient portal called "MyChart".  Sign up information is provided on this After Visit Summary.  MyChart is used to connect with patients for Virtual Visits (Telemedicine).  Patients are able to view lab/test results, encounter notes, upcoming appointments, etc.  Non-urgent messages can be sent to your provider as well.   To learn more about what you can do with MyChart, go to [email or web address].    Your next appointment:   12 month(s)  The format for your next appointment:   In Person  Provider:   Brian Crenshaw, MD   

## 2022-05-18 ENCOUNTER — Other Ambulatory Visit: Payer: Self-pay | Admitting: Cardiology

## 2022-05-18 DIAGNOSIS — E7800 Pure hypercholesterolemia, unspecified: Secondary | ICD-10-CM

## 2022-05-18 DIAGNOSIS — I2510 Atherosclerotic heart disease of native coronary artery without angina pectoris: Secondary | ICD-10-CM

## 2022-09-30 DIAGNOSIS — H2513 Age-related nuclear cataract, bilateral: Secondary | ICD-10-CM | POA: Diagnosis not present

## 2022-12-02 IMAGING — CT CT CARDIAC CORONARY ARTERY CALCIUM SCORE
3 series · 14 of 20 positions shown, 16 images · non-contrast
Comparison: None.

CLINICAL DATA: 64-year-old Caucasian male with history of elevated
cholesterol.

EXAM:
CT CARDIAC CORONARY ARTERY CALCIUM SCORE
TECHNIQUE: Non-contrast imaging through the heart was performed using
prospective ECG gating. Image post processing was performed on an
independent workstation, allowing for quantitative analysis of the
heart and coronary arteries. Note that this exam targets the heart
and the chest was not imaged in its entirety.

[Series 2: calcium scoring 2.00 qr36 bestdiast 70% hrt calciu · axial · 0.45mm/px · z∈[+1663,+1735]mm · 4 of 60 slices shown]
[im 12/60  vessel]
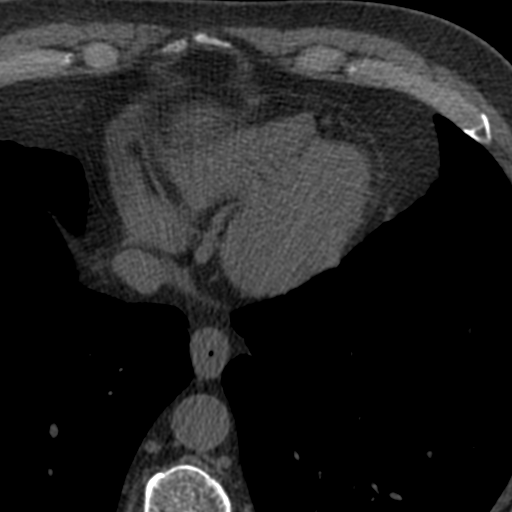
[im 24/60  vessel]
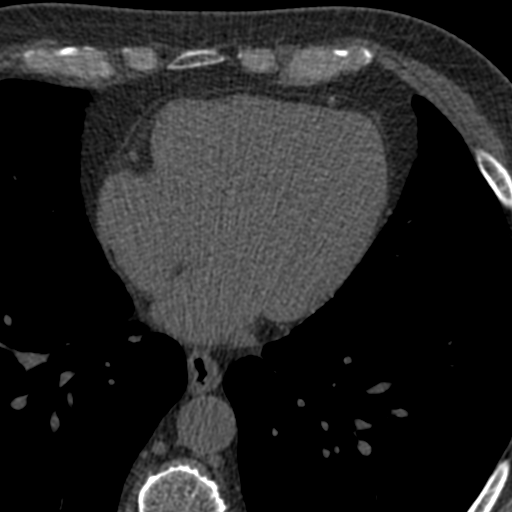
[im 36/60  vessel]
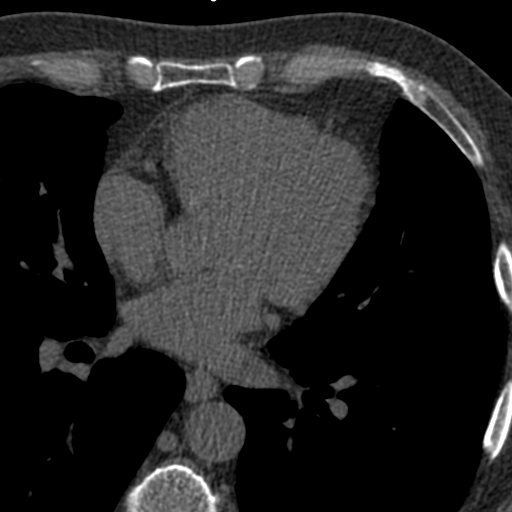
[im 48/60  vessel]
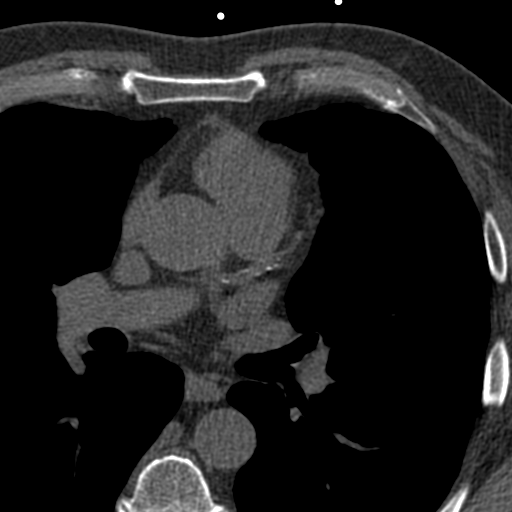

[Series 3: calcium scoring 2.00 br40 bestdiast 70% axial · axial · 0.65mm/px · z∈[+1659,+1739]mm · 5 of 60 slices shown, 7 images]
[im 10/60  vessel]
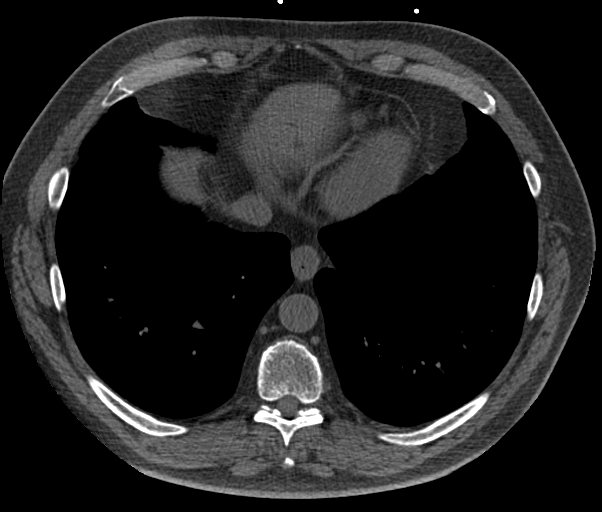
[im 10/60  lung]
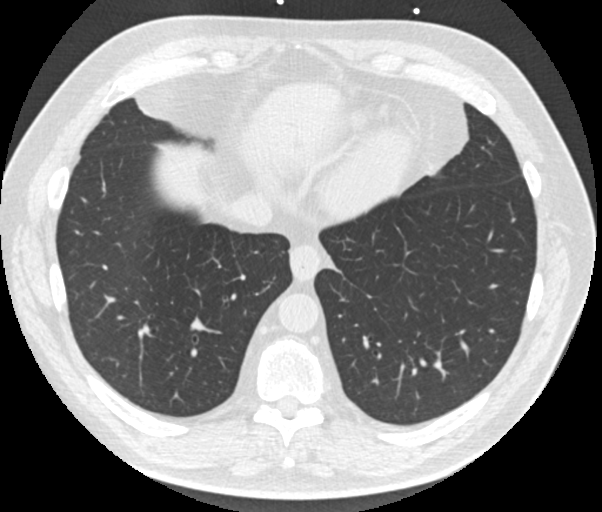
[im 20/60  vessel]
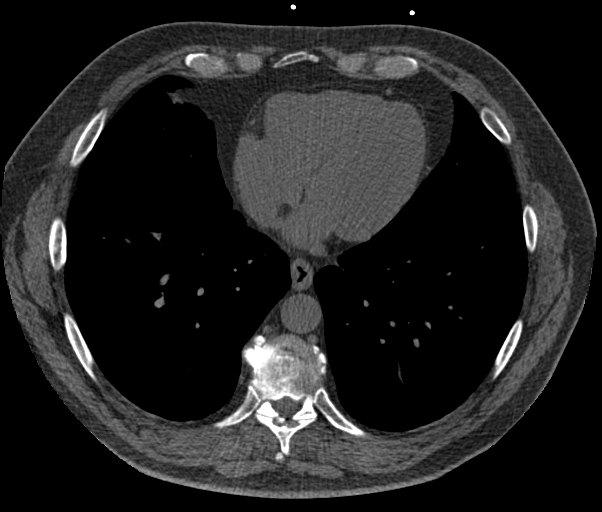
[im 30/60  vessel]
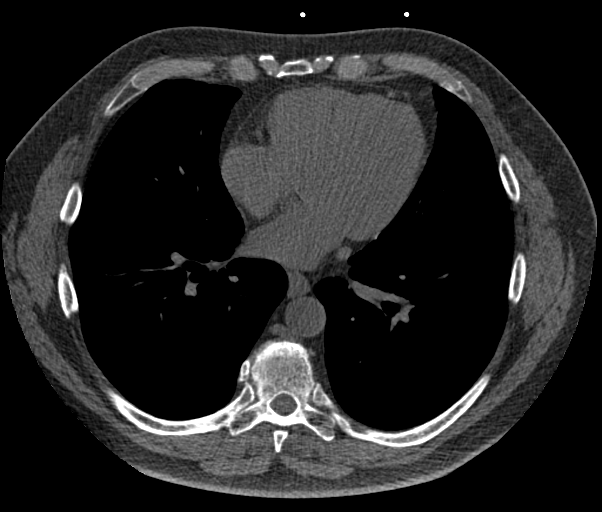
[im 40/60  vessel]
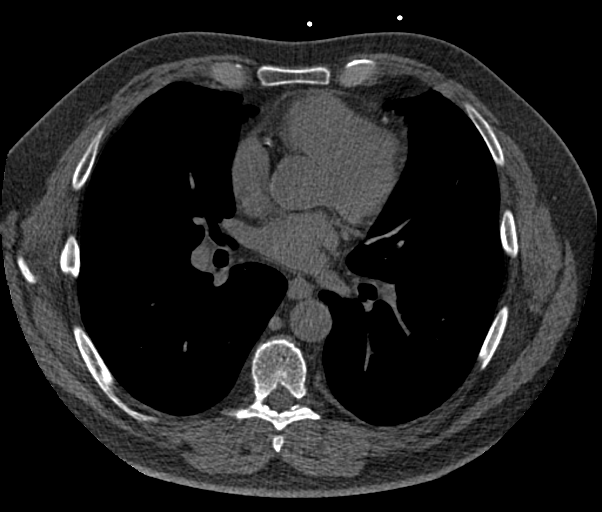
[im 50/60  vessel]
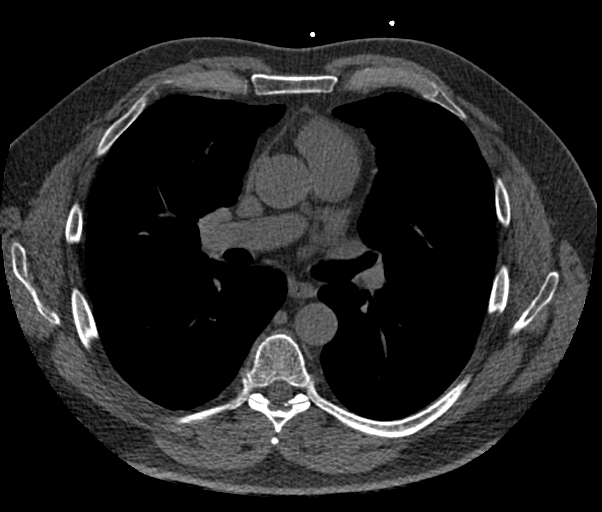
[im 50/60  lung]
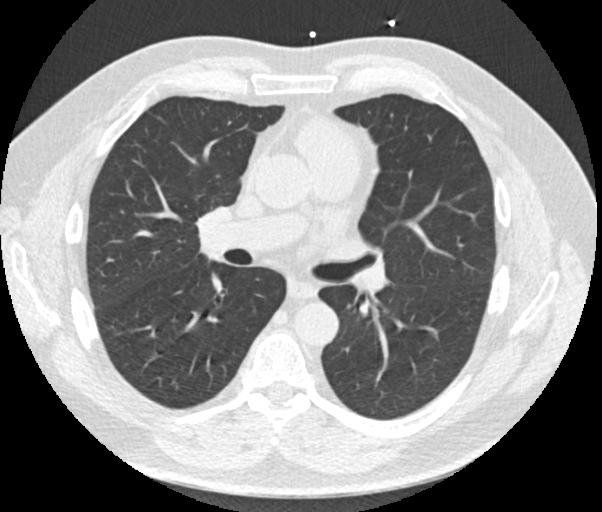

[Series 9: calcium scoring 2.00 br60 bestdiast 70% lungs · axial · 0.65mm/px · z∈[+1659,+1739]mm · 5 of 60 slices shown]
[im 10/60  vessel]
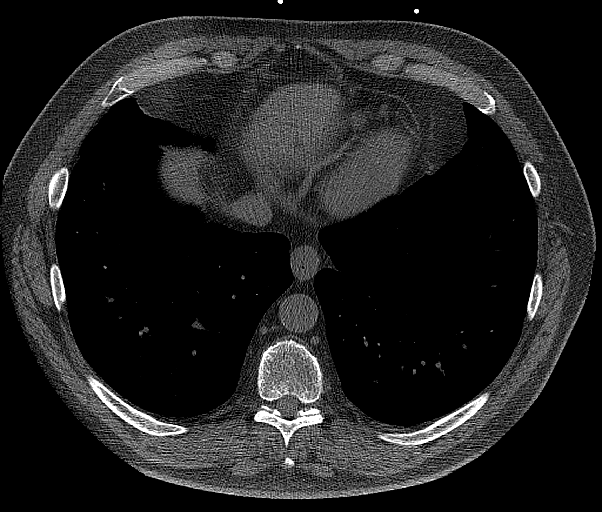
[im 20/60  vessel]
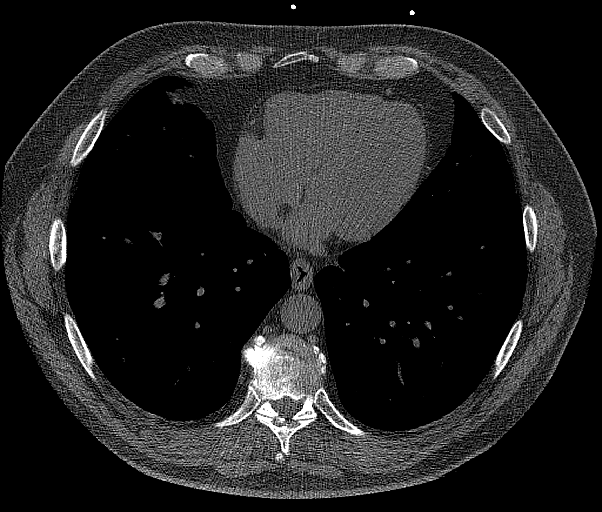
[im 30/60  vessel]
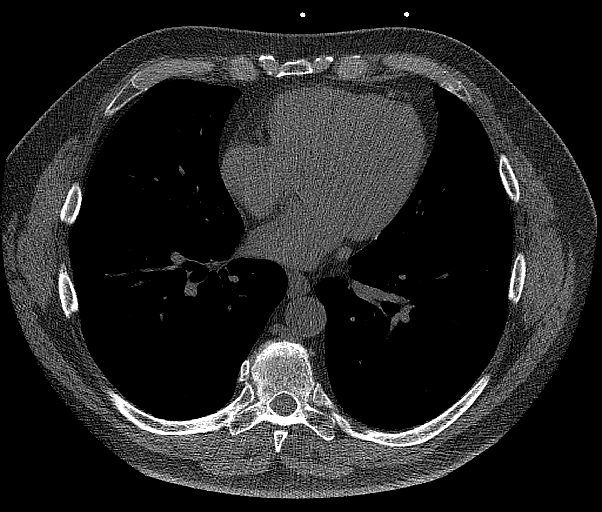
[im 40/60  vessel]
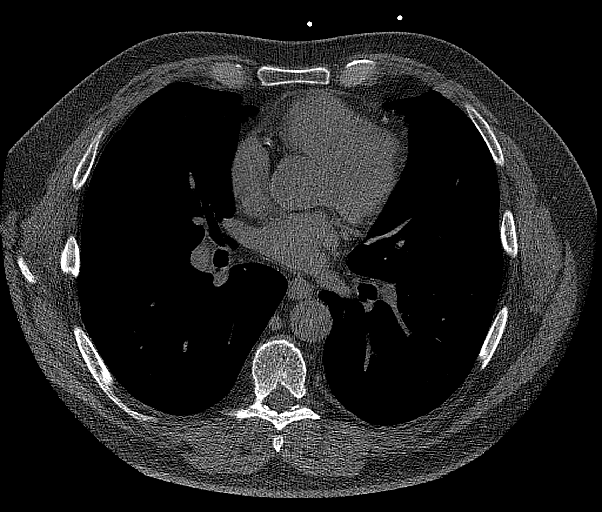
[im 50/60  vessel]
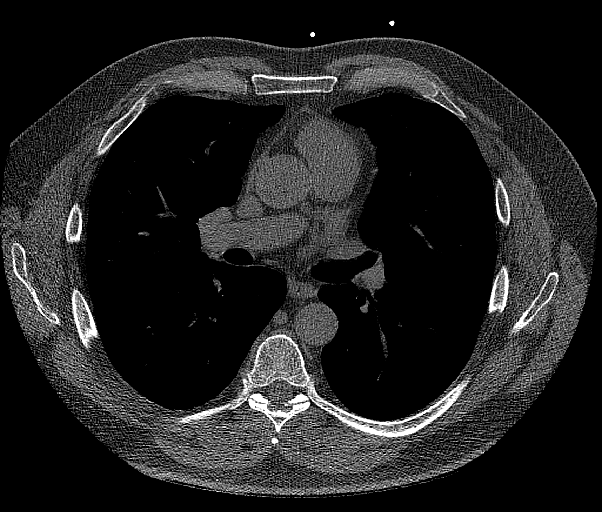

[14 of 20 positions shown; findings below may reference images not displayed]

FINDINGS: CORONARY CALCIUM SCORES:

Left Main: 0

LAD: 563

LCx:

RCA:

Total Agatston Score: 673

[HOSPITAL] percentile: 87

AORTA MEASUREMENTS:

Ascending Aorta: 35 mm

Descending Aorta: 27 mm

OTHER FINDINGS:

The heart size is within normal limits. No pericardial fluid
identified. Visualized segments of the thoracic aorta and central
pulmonary arteries are of normal caliber. Visualized mediastinum and
hilar regions demonstrate no lymphadenopathy or masses. Visualized
lungs show no evidence of pulmonary edema, consolidation,
pneumothorax, nodule or pleural fluid. Visualized upper liver
demonstrates small hypodense areas likely representing benign cysts.
Visualized bony structures are unremarkable.
IMPRESSION: Coronary calcium score of 673 is at the 87th percentile for the
patient's age, sex and race peer

## 2023-01-28 DIAGNOSIS — Z125 Encounter for screening for malignant neoplasm of prostate: Secondary | ICD-10-CM | POA: Diagnosis not present

## 2023-01-28 DIAGNOSIS — I2510 Atherosclerotic heart disease of native coronary artery without angina pectoris: Secondary | ICD-10-CM | POA: Diagnosis not present

## 2023-01-29 LAB — LAB REPORT - SCANNED: EGFR: 60

## 2023-02-03 DIAGNOSIS — M79671 Pain in right foot: Secondary | ICD-10-CM | POA: Diagnosis not present

## 2023-02-03 DIAGNOSIS — M25562 Pain in left knee: Secondary | ICD-10-CM | POA: Diagnosis not present

## 2023-02-03 DIAGNOSIS — R7401 Elevation of levels of liver transaminase levels: Secondary | ICD-10-CM | POA: Diagnosis not present

## 2023-02-03 DIAGNOSIS — Z0000 Encounter for general adult medical examination without abnormal findings: Secondary | ICD-10-CM | POA: Diagnosis not present

## 2023-02-03 DIAGNOSIS — R030 Elevated blood-pressure reading, without diagnosis of hypertension: Secondary | ICD-10-CM | POA: Diagnosis not present

## 2023-02-03 DIAGNOSIS — M25561 Pain in right knee: Secondary | ICD-10-CM | POA: Diagnosis not present

## 2023-02-03 DIAGNOSIS — M79672 Pain in left foot: Secondary | ICD-10-CM | POA: Diagnosis not present

## 2023-02-03 DIAGNOSIS — I2510 Atherosclerotic heart disease of native coronary artery without angina pectoris: Secondary | ICD-10-CM | POA: Diagnosis not present

## 2023-02-03 DIAGNOSIS — Z6835 Body mass index (BMI) 35.0-35.9, adult: Secondary | ICD-10-CM | POA: Diagnosis not present

## 2023-02-19 DIAGNOSIS — M25561 Pain in right knee: Secondary | ICD-10-CM | POA: Diagnosis not present

## 2023-02-19 DIAGNOSIS — M25562 Pain in left knee: Secondary | ICD-10-CM | POA: Diagnosis not present

## 2023-02-24 DIAGNOSIS — R030 Elevated blood-pressure reading, without diagnosis of hypertension: Secondary | ICD-10-CM | POA: Diagnosis not present

## 2023-02-25 DIAGNOSIS — M79672 Pain in left foot: Secondary | ICD-10-CM | POA: Diagnosis not present

## 2023-02-25 DIAGNOSIS — M67872 Other specified disorders of synovium, left ankle and foot: Secondary | ICD-10-CM | POA: Diagnosis not present

## 2023-02-25 DIAGNOSIS — M7660 Achilles tendinitis, unspecified leg: Secondary | ICD-10-CM | POA: Insufficient documentation

## 2023-03-19 DIAGNOSIS — M67872 Other specified disorders of synovium, left ankle and foot: Secondary | ICD-10-CM | POA: Diagnosis not present

## 2023-03-19 DIAGNOSIS — M79672 Pain in left foot: Secondary | ICD-10-CM | POA: Diagnosis not present

## 2023-03-30 ENCOUNTER — Other Ambulatory Visit: Payer: Self-pay | Admitting: *Deleted

## 2023-03-30 DIAGNOSIS — I714 Abdominal aortic aneurysm, without rupture, unspecified: Secondary | ICD-10-CM

## 2023-04-01 NOTE — Progress Notes (Signed)
HPI: Follow-up coronary artery disease. Calcium score July 2022 673 (LAD 563) which was 87 percentile. Nuclear study November 2022 showed no ischemia and ejection fraction 52%.  Abdominal ultrasound December 2022 showed ectatic abdominal aorta at 2.8 cm and left common iliac of 1.7 cm.  Abdominal ultrasound October 2024 unchanged.  Since last seen there is no dyspnea, chest pain, palpitations or syncope.  Current Outpatient Medications  Medication Sig Dispense Refill   Ascorbic Acid 500 MG CAPS as directed Orally     aspirin EC 81 MG tablet Take 1 tablet (81 mg total) by mouth daily. Swallow whole. 90 tablet 3   calcium carbonate (TUMS - DOSED IN MG ELEMENTAL CALCIUM) 500 MG chewable tablet Chew 1 tablet by mouth 2 (two) times daily as needed for indigestion or heartburn.     ezetimibe (ZETIA) 10 MG tablet      Multiple Vitamin (MULTI VITAMIN) TABS 1 tablet Orally Once a day     Omega-3 Fatty Acids (FISH OIL) 1200 MG CAPS 1 capsule Orally Three times a day     rosuvastatin (CRESTOR) 40 MG tablet TAKE ONE TABLET BY MOUTH DAILY 90 tablet 3   naproxen (NAPROSYN) 500 MG tablet Take 1 tablet (500 mg total) by mouth 3 (three) times daily with meals. (Patient not taking: Reported on 04/14/2023) 30 tablet 0   No current facility-administered medications for this visit.     Past Medical History:  Diagnosis Date   CAD (coronary artery disease)    Hernia, umbilical    Hyperlipidemia    Hypertension     Past Surgical History:  Procedure Laterality Date   KNEE SURGERY      Social History   Socioeconomic History   Marital status: Married    Spouse name: Not on file   Number of children: 2   Years of education: Not on file   Highest education level: Not on file  Occupational History   Not on file  Tobacco Use   Smoking status: Never   Smokeless tobacco: Not on file  Substance and Sexual Activity   Alcohol use: No   Drug use: Not on file   Sexual activity: Not on file  Other  Topics Concern   Not on file  Social History Narrative   Not on file   Social Determinants of Health   Financial Resource Strain: Not on file  Food Insecurity: Not on file  Transportation Needs: Not on file  Physical Activity: Not on file  Stress: Not on file  Social Connections: Not on file  Intimate Partner Violence: Not on file    Family History  Problem Relation Age of Onset   Aortic aneurysm Father     ROS: no fevers or chills, productive cough, hemoptysis, dysphasia, odynophagia, melena, hematochezia, dysuria, hematuria, rash, seizure activity, orthopnea, PND, pedal edema, claudication. Remaining systems are negative.  Physical Exam: Well-developed well-nourished in no acute distress.  Skin is warm and dry.  HEENT is normal.  Neck is supple.  Chest is clear to auscultation with normal expansion.  Cardiovascular exam is regular rate and rhythm.  Abdominal exam nontender or distended. No masses palpated. Extremities show no edema. neuro grossly intact  EKG Interpretation Date/Time:  Tuesday April 14 2023 09:20:56 EDT Ventricular Rate:  75 PR Interval:  174 QRS Duration:  112 QT Interval:  392 QTC Calculation: 437 R Axis:   57  Text Interpretation: Normal sinus rhythm Normal ECG No previous ECGs available Confirmed by Olga Millers (  [id number]) on 04/14/2023 9:26:26 AM    A/P  1 coronary artery disease-patient denies chest pain.  Continue medical therapy with aspirin and statin.  2 ectatic abdominal aorta/iliac artery-we will arrange follow-up ultrasound October 2026.  3 hypertension-blood pressure controlled.    4 hyperlipidemia-continue Crestor and Zetia.  Olga Millers, MD

## 2023-04-14 ENCOUNTER — Encounter: Payer: Self-pay | Admitting: Cardiology

## 2023-04-14 ENCOUNTER — Ambulatory Visit (HOSPITAL_COMMUNITY)
Admission: RE | Admit: 2023-04-14 | Discharge: 2023-04-14 | Disposition: A | Discharge status: Home / Self Care | Payer: Medicare HMO | Source: Ambulatory Visit | Attending: Internal Medicine | Admitting: Internal Medicine

## 2023-04-14 ENCOUNTER — Ambulatory Visit: Payer: Medicare HMO | Admitting: Cardiology

## 2023-04-14 VITALS — BP 134/86 | HR 75 | Ht 70.0 in | Wt 236.0 lb

## 2023-04-14 DIAGNOSIS — I714 Abdominal aortic aneurysm, without rupture, unspecified: Secondary | ICD-10-CM | POA: Insufficient documentation

## 2023-04-14 DIAGNOSIS — I2510 Atherosclerotic heart disease of native coronary artery without angina pectoris: Secondary | ICD-10-CM | POA: Insufficient documentation

## 2023-04-14 DIAGNOSIS — E7800 Pure hypercholesterolemia, unspecified: Secondary | ICD-10-CM | POA: Diagnosis not present

## 2023-04-14 NOTE — Patient Instructions (Signed)

## 2023-04-16 ENCOUNTER — Encounter: Payer: Self-pay | Admitting: *Deleted

## 2023-04-16 ENCOUNTER — Other Ambulatory Visit: Payer: Self-pay | Admitting: *Deleted

## 2023-04-16 DIAGNOSIS — I714 Abdominal aortic aneurysm, without rupture, unspecified: Secondary | ICD-10-CM

## 2023-05-22 ENCOUNTER — Other Ambulatory Visit: Payer: Self-pay | Admitting: Cardiology

## 2023-05-22 DIAGNOSIS — E7800 Pure hypercholesterolemia, unspecified: Secondary | ICD-10-CM

## 2023-05-22 DIAGNOSIS — I2510 Atherosclerotic heart disease of native coronary artery without angina pectoris: Secondary | ICD-10-CM

## 2023-06-23 ENCOUNTER — Ambulatory Visit: Payer: Medicare HMO | Admitting: Cardiology

## 2023-07-20 DIAGNOSIS — K420 Umbilical hernia with obstruction, without gangrene: Secondary | ICD-10-CM | POA: Diagnosis not present

## 2023-07-20 DIAGNOSIS — R053 Chronic cough: Secondary | ICD-10-CM | POA: Diagnosis not present

## 2023-07-24 ENCOUNTER — Ambulatory Visit: Payer: Self-pay | Admitting: General Surgery

## 2023-07-24 ENCOUNTER — Telehealth: Payer: Self-pay | Admitting: *Deleted

## 2023-07-24 DIAGNOSIS — K429 Umbilical hernia without obstruction or gangrene: Secondary | ICD-10-CM | POA: Diagnosis not present

## 2023-07-24 NOTE — Telephone Encounter (Signed)
   Pre-operative Risk Assessment    Patient Name: Christopher Whitaker  DOB: 05-Mar-1956 MRN: [id number]   Date of last office visit: 04/13/24 Date of next office visit: N/A   Request for Surgical Clearance    Procedure:   UMBILICAL HERNIA REPAIR  Date of Surgery:  Clearance TBD                                Surgeon:  DR. CORDELLA IDLER Surgeon's Group or Practice Name:  [facility] Phone number:  [phone] Fax number:  [phone]   Type of Clearance Requested:   - Medical  - Pharmacy:  Hold Aspirin  NOT INDICATED HOW LONG   Type of Anesthesia:  General    Additional requests/questions:    [name]   07/24/2023, 2:02 PM ]

## 2023-07-24 NOTE — Telephone Encounter (Signed)
   Name: Christopher Whitaker  DOB: 10-23-1955  MRN: [id number]  Primary Cardiologist: Redell Shallow, MD   Preoperative team, please contact this patient and set up a phone call appointment for further preoperative risk assessment. Please obtain consent and complete medication review. Thank you for your help.  I confirm that guidance regarding antiplatelet and oral anticoagulation therapy has been completed and, if necessary, noted below.  His aspirin  may be held for 5 to 7 days prior to his procedure.  Please resume as soon as hemostasis is achieved.  I also confirmed the patient resides in the state of [place] . As per [facility] Medical Board telemedicine laws, the patient must reside in the state in which the provider is licensed.   Josefa CHRISTELLA Beauvais, NP 07/24/2023, 3:17 PM [facility]

## 2023-07-27 NOTE — Telephone Encounter (Deleted)
 Dr. Davonna Estes office did call back and left vm yes need to hold Warfarin. As far as urgency states pt has been scheduled since Jan 2025, however the hospital just told then they need cardiac clearance.   I will update the preop APP for further advice.

## 2023-07-28 NOTE — Telephone Encounter (Signed)
Left message to call back to schedule tele pre op appt.

## 2023-07-28 NOTE — Telephone Encounter (Signed)
S/w the pt and he has opted for in office appt as this was sooner than the tele appt availability. Ok per preop APP if pt wants to sched in office appt.   08/04/23 @ 1:55 Edd Fabian, FNP.   I will update all parties involved. Pt asked to be put ion wait list which I have done.

## 2023-07-28 NOTE — Telephone Encounter (Signed)
Pt returning call/requesting c/b

## 2023-07-30 NOTE — Patient Instructions (Signed)
DUE TO COVID-19 ONLY TWO VISITORS  (aged 69 and older)  ARE ALLOWED TO COME WITH YOU AND STAY IN THE WAITING ROOM ONLY DURING PRE OP AND PROCEDURE.   **NO VISITORS ARE ALLOWED IN THE SHORT STAY AREA OR RECOVERY ROOM!!**  IF YOU WILL BE ADMITTED INTO THE HOSPITAL YOU ARE ALLOWED ONLY FOUR SUPPORT PEOPLE DURING VISITATION HOURS ONLY (7 AM -8PM)   The support person(s) must pass our screening, gel in and out, and wear a mask at all times, including in the patient's room. Patients must also wear a mask when staff or their support person are in the room. Visitors GUEST BADGE MUST BE WORN VISIBLY  One adult visitor may remain with you overnight and MUST be in the room by 8 P.M.     Your procedure is scheduled on: 08/10/23   Report to [facility] Main Entrance    Report to admitting at : 12:45 PM   Call this number if you have problems the morning of surgery [phone]   Eat a light diet the day before surgery.  Examples including soups, broths, toast, yogurt, mashed potatoes.  Things to avoid include carbonated beverages (fizzy beverages), raw fruits and raw vegetables, or beans.   If your bowels are filled with gas, your surgeon will have difficulty visualizing your pelvic organs which increases your surgical risks.  Do not eat food :After Midnight.   After Midnight you may have the following liquids until : 12:00 PM DAY OF SURGERY  Water Black Coffee (sugar ok, NO MILK/CREAM OR CREAMERS)  Tea (sugar ok, NO MILK/CREAM OR CREAMERS) regular and decaf                             Plain Jell-O (NO RED)                                           Fruit ices (not with fruit pulp, NO RED)                                     Popsicles (NO RED)                                                                  Juice: apple, WHITE grape, WHITE cranberry Sports drinks like Gatorade (NO RED)              FOLLOW ANY ADDITIONAL PRE OP INSTRUCTIONS YOU RECEIVED FROM YOUR SURGEON'S OFFICE!!!    Oral Hygiene is also important to reduce your risk of infection.                                    Remember - BRUSH YOUR TEETH THE MORNING OF SURGERY WITH YOUR REGULAR TOOTHPASTE  DENTURES WILL BE REMOVED PRIOR TO SURGERY PLEASE DO NOT APPLY "Poly grip" OR ADHESIVES!!!   Do NOT smoke after Midnight   Take these medicines the morning of surgery with A SIP OF  WATER: NONE. Hold vitamins and supplements 7 days before surgery.  DO NOT TAKE ANY ORAL DIABETIC MEDICATIONS DAY OF YOUR SURGERY                              You may not have any metal on your body including hair pins, jewelry, and body piercing             Do not wear lotions, powders, perfumes/cologne, or deodorant              Men may shave face and neck.   Do not bring valuables to the hospital. [facility] IS NOT             RESPONSIBLE   FOR VALUABLES.   Contacts, glasses, or bridgework may not be worn into surgery.   Bring small overnight bag day of surgery.   DO NOT BRING YOUR HOME MEDICATIONS TO THE HOSPITAL. PHARMACY WILL DISPENSE MEDICATIONS LISTED ON YOUR MEDICATION LIST TO YOU DURING YOUR ADMISSION IN THE HOSPITAL!    Patients discharged on the day of surgery will not be allowed to drive home.  Someone NEEDS to stay with you for the first 24 hours after anesthesia.   Special Instructions: Bring a copy of your healthcare power of attorney and living will documents         the day of surgery if you haven't scanned them before.              Please read over the following fact sheets you were given: IF YOU HAVE QUESTIONS ABOUT YOUR PRE-OP INSTRUCTIONS PLEASE CALL [phone]    [facility] - Preparing for Surgery Before surgery, you can play an important role.  Because skin is not sterile, your skin needs to be as free of germs as possible.  You can reduce the number of germs on your skin by washing with CHG (chlorahexidine gluconate) soap before surgery.  CHG is an antiseptic cleaner which kills germs and bonds  with the skin to continue killing germs even after washing. Please DO NOT use if you have an allergy to CHG or antibacterial soaps.  If your skin becomes reddened/irritated stop using the CHG and inform your nurse when you arrive at Short Stay. Do not shave (including legs and underarms) for at least 48 hours prior to the first CHG shower.  You may shave your face/neck. Please follow these instructions carefully:  1.  Shower with CHG Soap the night before surgery and the  morning of Surgery.  2.  If you choose to wash your hair, wash your hair first as usual with your  normal  shampoo.  3.  After you shampoo, rinse your hair and body thoroughly to remove the  shampoo.                           4.  Use CHG as you would any other liquid soap.  You can apply chg directly  to the skin and wash                       Gently with a scrungie or clean washcloth.  5.  Apply the CHG Soap to your body ONLY FROM THE NECK DOWN.   Do not use on face/ open  Wound or open sores. Avoid contact with eyes, ears mouth and genitals (private parts).                       Wash face,  Genitals (private parts) with your normal soap.             6.  Wash thoroughly, paying special attention to the area where your surgery  will be performed.  7.  Thoroughly rinse your body with warm water from the neck down.  8.  DO NOT shower/wash with your normal soap after using and rinsing off  the CHG Soap.                9.  Pat yourself dry with a clean towel.            10.  Wear clean pajamas.            11.  Place clean sheets on your bed the night of your first shower and do not  sleep with pets. Day of Surgery : Do not apply any lotions/deodorants the morning of surgery.  Please wear clean clothes to the hospital/surgery center.  FAILURE TO FOLLOW THESE INSTRUCTIONS MAY RESULT IN THE CANCELLATION OF YOUR SURGERY PATIENT SIGNATURE_________________________________  NURSE  SIGNATURE__________________________________  ________________________________________________________________________

## 2023-07-31 NOTE — Progress Notes (Unsigned)
Cardiology Clinic Note   Patient Name: Christopher Whitaker Date of Encounter: 08/04/2023  Primary Care Provider:  Merri Brunette, MD Primary Cardiologist:  Olga Millers, MD  Patient Profile    Christopher Whitaker 68 year old male presents to the clinic today for follow-up evaluation of his coronary artery disease and preoperative cardiac evaluation.  Past Medical History    Past Medical History:  Diagnosis Date   Arthritis    CAD (coronary artery disease)    Hernia, umbilical    Hyperlipidemia    Hypertension    Past Surgical History:  Procedure Laterality Date   COLONOSCOPY     KNEE SURGERY Right    meniscus repair    Allergies  No Known Allergies  History of Present Illness    Christopher Whitaker has a PMH of coronary artery disease, abdominal aortic aneurysm, and hyperlipidemia.  He underwent calcium scoring 7/22 which showed a score of 673.  His LAD was noted to have 563 which placed him in the 87th percentile for age gender and sex matched controls.  He underwent nuclear stress testing 11/22 which showed no ischemia and an EF of 55%.  His abdominal ultrasound 12/22 showed ectatic abdominal aorta at 2.8 cm and left common iliac of 1.7 cm.  His abdominal ultrasound 10/24 was unchanged.  He was seen in follow-up by Dr. Jens Som on 04/14/2023.  During that time he did not dyspnea, chest pain, palpitations and syncope.   He presents to the clinic today for follow-up evaluation and preoperative cardiac evaluation.  He states he was very physically active until his wife had knee surgery.  Then he was not going to the gym as regular due to helping care for her.  We reviewed his previous coronary calcium score.  He expressed understanding.  We reviewed his upcoming surgery.  He notes that he has a hernia that is becoming more difficult to reduce.  We reviewed his aspirin recommendations.  He expressed understanding.  His EKG today shows sinus tachycardia.  He is currently taking medication for URI.   I will give him the salty 6 diet sheet and plan follow-up in 9 to 12 months.  Today he denies chest pain, shortness of breath, lower extremity edema, fatigue, palpitations, melena, hematuria, hemoptysis, diaphoresis, weakness, presyncope, syncope, orthopnea, and PND.    Home Medications    Prior to Admission medications   Medication Sig Start Date End Date Taking? Authorizing Provider  ASCORBIC ACID PO Take 1 tablet by mouth 2 (two) times a week.    [provider]  aspirin EC 81 MG tablet Take 1 tablet (81 mg total) by mouth daily. Swallow whole. 04/29/21   Lewayne Bunting, MD  B Complex-C (B-COMPLEX WITH VITAMIN C) tablet Take 1 tablet by mouth 2 (two) times a week.    [provider]  calcium carbonate (TUMS - DOSED IN MG ELEMENTAL CALCIUM) 500 MG chewable tablet Chew 1 tablet by mouth 2 (two) times daily as needed for indigestion or heartburn.    [provider]  ezetimibe (ZETIA) 10 MG tablet Take 10 mg by mouth in the morning.    [provider]  guaiFENesin-codeine 100-10 MG/5ML syrup Take 5 mLs by mouth every 6 (six) hours as needed for cough.    [provider]  meloxicam (MOBIC) 7.5 MG tablet Take 7.5 mg by mouth daily as needed (pain.). 05/19/23   [provider]  Multiple Vitamin (MULTIVITAMIN WITH MINERALS) TABS tablet Take 1 tablet by mouth daily. One  A Day for Men    [provider]  Omega-3 Fatty Acids (FISH OIL PO) Take 1 capsule by mouth 2 (two) times a week.    [provider]  rosuvastatin (CRESTOR) 40 MG tablet TAKE 1 TABLET BY MOUTH DAILY 05/25/23   Lewayne Bunting, MD    Family History    Family History  Problem Relation Age of Onset   Aortic aneurysm Father    He indicated that his mother is alive. He indicated that his father is alive.  Social History    Social History   Socioeconomic History   Marital status: Married    Spouse name: Not on file   Number of children: 2   Years of  education: Not on file   Highest education level: Not on file  Occupational History   Not on file  Tobacco Use   Smoking status: Never   Smokeless tobacco: Not on file  Vaping Use   Vaping status: Never Used  Substance and Sexual Activity   Alcohol use: No   Drug use: Never   Sexual activity: Not on file  Other Topics Concern   Not on file  Social History Narrative   Not on file   Social Drivers of Health   Financial Resource Strain: Not on file  Food Insecurity: Not on file  Transportation Needs: Not on file  Physical Activity: Not on file  Stress: Not on file  Social Connections: Not on file  Intimate Partner Violence: Not on file     Review of Systems    General:  No chills, fever, night sweats or weight changes.  Cardiovascular:  No chest pain, dyspnea on exertion, edema, orthopnea, palpitations, paroxysmal nocturnal dyspnea. Dermatological: No rash, lesions/masses Respiratory: No cough, dyspnea Urologic: No hematuria, dysuria Abdominal:   No nausea, vomiting, diarrhea, bright red blood per rectum, melena, or hematemesis Neurologic:  No visual changes, wkns, changes in mental status. All other systems reviewed and are otherwise negative except as noted above.  Physical Exam    VS:  BP 116/84   Pulse (!) 103   Ht 5\' 11"  (1.803 m)   Wt 234 lb (106.1 kg)   SpO2 94%   BMI 32.64 kg/m  , BMI Body mass index is 32.64 kg/m. GEN: Well nourished, well developed, in no acute distress. HEENT: normal. Neck: Supple, no JVD, carotid bruits, or masses. Cardiac: RRR, no murmurs, rubs, or gallops. No clubbing, cyanosis, edema.  Radials/DP/PT 2+ and equal bilaterally.  Respiratory:  Respirations regular and unlabored, clear to auscultation bilaterally. GI: Soft, nontender, nondistended, BS + x 4.  Abdominal pain on palpation MS: no deformity or atrophy. Skin: warm and dry, no rash. Neuro:  Strength and sensation are intact. Psych: Normal affect.  Accessory Clinical  Findings    Recent Labs: 08/03/2023: BUN 17; Creatinine, Ser 0.95; Hemoglobin 14.5; Platelets 307; Potassium 4.0; Sodium 136   Recent Lipid Panel    Component Value Date/Time   CHOL 147 09/13/2021 0819   TRIG 69 09/13/2021 0819   HDL 51 09/13/2021 0819   CHOLHDL 2.9 09/13/2021 0819   LDLCALC 82 09/13/2021 0819         ECG personally reviewed by me today- EKG Interpretation Date/Time:  Tuesday August 04 2023 14:10:21 EST Ventricular Rate:  103 PR Interval:  158 QRS Duration:  98 QT Interval:  350 QTC Calculation: 458 R Axis:   69  Text Interpretation: Sinus tachycardia When compared with ECG of 14-Apr-2023 09:20, No significant change  was found Confirmed by Edd Fabian [phone]) on 08/04/2023 2:17:05 PM   Coronary calcium scoring 12/14/2020   TECHNIQUE: Non-contrast imaging through the heart was performed using prospective ECG gating. Image post processing was performed on an independent workstation, allowing for quantitative analysis of the heart and coronary arteries. Note that this exam targets the heart and the chest was not imaged in its entirety.   COMPARISON:  None.   FINDINGS: CORONARY CALCIUM SCORES:   Left Main: 0   LAD: 563   LCx: 41.5   RCA: 68.3   Total Agatston Score: 673   MESA database percentile: 87   AORTA MEASUREMENTS:   Ascending Aorta: 35 mm   Descending Aorta: 27 mm   OTHER FINDINGS:   The heart size is within normal limits. No pericardial fluid identified. Visualized segments of the thoracic aorta and central pulmonary arteries are of normal caliber. Visualized mediastinum and hilar regions demonstrate no lymphadenopathy or masses. Visualized lungs show no evidence of pulmonary edema, consolidation, pneumothorax, nodule or pleural fluid. Visualized upper liver demonstrates small hypodense areas likely representing benign cysts. Visualized bony structures are unremarkable.   IMPRESSION: Coronary calcium score of 673 is at  the 87th percentile for the patient's age, sex and race .   Electronically Signed   By: Irish Lack M.D.   On: 12/14/2020 12:21     Assessment & Plan   1.  Coronary artery disease-denies anginal type symptoms.  Continues to be fairly physically active. Continue aspirin,Ezetimibe, rosuvastatin Maintain physical activity Heart healthy low-sodium diet  Hyperlipidemia-LDL 55 on 01/28/23. High-fiber diet Continue rosuvastatin, ezetimibe, aspirin, omega-3's Maintain physical activity  Essential hypertension-BP today 116/84. Maintain blood pressure log Heart healthy low-sodium diet  Ectatic aorta/iliac arteries-denies chest or back discomfort.  Previously evaluated and plan for repeat abdominal aorta ultrasound 10/26.  Perop cardiac evaluation-Procedure:   UMBILICAL HERNIA REPAIR   Date of Surgery:  Clearance TBD                                  Surgeon:  DR. Melody Haver Surgeon's Group or Practice Name:  [facility] Phone number:  [phone] Fax number:  [phone]     Primary Cardiologist: Olga Millers, MD  Chart reviewed as part of pre-operative protocol coverage. Given past medical history and time since last visit, based on ACC/AHA guidelines, Johsua Shevlin would be at acceptable risk for the planned procedure without further cardiovascular testing.   His aspirin may be held for 5 to 7 days prior to his procedure. Please resume as soon as hemostasis is achieved.   His RCRI is low risk, 0.9% risk of major cardiac event.  He is able to complete greater than 4 METS of physical activity.  Patient was advised that if he develops new symptoms prior to surgery to contact our office to arrange a follow-up appointment.  He verbalized understanding.  I will route this recommendation to the requesting party via Epic fax function and remove from pre-op pool.    Disposition: Follow-up with Dr. Jens Som in 9-12 months.   Thomasene Ripple. Zarahi Fuerst NP-C     08/04/2023, 2:37 PM [facility] [address] Office [phone] Fax [phone]    I spent 14 minutes examining this patient, reviewing medications, and using patient centered shared decision making involving their cardiac care.   I spent  20 minutes reviewing past medical history,  medications, and prior  cardiac tests.

## 2023-08-03 ENCOUNTER — Other Ambulatory Visit: Payer: Self-pay

## 2023-08-03 ENCOUNTER — Encounter (HOSPITAL_COMMUNITY)
Admission: RE | Admit: 2023-08-03 | Discharge: 2023-08-03 | Disposition: A | Discharge status: Home / Self Care | Payer: Medicare HMO | Source: Ambulatory Visit | Attending: General Surgery | Admitting: General Surgery

## 2023-08-03 ENCOUNTER — Encounter (HOSPITAL_COMMUNITY): Payer: Self-pay

## 2023-08-03 VITALS — BP 134/99 | HR 100 | Temp 98.4°F | Ht 70.0 in | Wt 230.0 lb

## 2023-08-03 DIAGNOSIS — Z01818 Encounter for other preprocedural examination: Secondary | ICD-10-CM | POA: Diagnosis not present

## 2023-08-03 DIAGNOSIS — Z01812 Encounter for preprocedural laboratory examination: Secondary | ICD-10-CM | POA: Diagnosis not present

## 2023-08-03 DIAGNOSIS — I2510 Atherosclerotic heart disease of native coronary artery without angina pectoris: Secondary | ICD-10-CM | POA: Insufficient documentation

## 2023-08-03 HISTORY — DX: Unspecified osteoarthritis, unspecified site: M19.90

## 2023-08-03 LAB — BASIC METABOLIC PANEL
Anion gap: 10 (ref 5–15)
BUN: 17 mg/dL (ref 8–23)
CO2: 22 mmol/L (ref 22–32)
Calcium: 9.2 mg/dL (ref 8.9–10.3)
Chloride: 104 mmol/L (ref 98–111)
Creatinine, Ser: 0.95 mg/dL (ref 0.61–1.24)
GFR, Estimated: >60 mL/min (ref >60)
Glucose, Bld: 86 mg/dL (ref 70–99)
Potassium: 4 mmol/L (ref 3.5–5.1)
Sodium: 136 mmol/L (ref 135–145)

## 2023-08-03 LAB — CBC
HCT: 45.4 % (ref 39.0–52.0)
Hemoglobin: 14.5 g/dL (ref 13.0–17.0)
MCH: 31 pg (ref 26.0–34.0)
MCHC: 31.9 g/dL (ref 30.0–36.0)
MCV: 97 fL (ref 80.0–100.0)
Platelets: 307 10*3/uL (ref 150–400)
RBC: 4.68 MIL/uL (ref 4.22–5.81)
RDW: 13.1 % (ref 11.5–15.5)
WBC: 7.1 10*3/uL (ref 4.0–10.5)
nRBC: 0 % (ref 0.0–0.2)

## 2023-08-03 NOTE — Progress Notes (Signed)
For Anesthesia: PCP - Merri Brunette, MD  Cardiologist - Lewayne Bunting, MD . Clearance: pending: appointment: 08/04/23@ 1:55 PM  Bowel Prep reminder:  Chest x-ray -  EKG - 04/14/23 Stress Test -  ECHO -  Cardiac Cath -  Pacemaker/ICD device last checked: Pacemaker orders received: Device Rep notified:  Spinal Cord Stimulator:N/A  Sleep Study - N/A CPAP -   Fasting Blood Sugar - N/A Checks Blood Sugar _____ times a day Date and result of last Hgb A1c-  Last dose of GLP1 agonist- N/A GLP1 instructions:   Last dose of SGLT-2 inhibitors- N/A SGLT-2 instructions:   Blood Thinner Instructions:N/A Aspirin Instructions: Pt. Will ask cardiologist tomorrow. Last Dose:  Activity level: Can go up a flight of stairs and activities of daily living without stopping and without chest pain and/or shortness of breath   Able to exercise without chest pain and/or shortness of breath  Anesthesia review: Hx: HTN,CAD  Patient denies shortness of breath, fever, cough and chest pain at PAT appointment   Patient verbalized understanding of instructions that were given to them at the PAT appointment. Patient was also instructed that they will need to review over the PAT instructions again at home before surgery.

## 2023-08-04 ENCOUNTER — Encounter: Payer: Self-pay | Admitting: General Practice

## 2023-08-04 ENCOUNTER — Ambulatory Visit: Payer: Medicare HMO | Attending: General Practice | Admitting: General Practice

## 2023-08-04 VITALS — BP 116/84 | HR 103 | Ht 71.0 in | Wt 234.0 lb

## 2023-08-04 DIAGNOSIS — Z01810 Encounter for preprocedural cardiovascular examination: Secondary | ICD-10-CM

## 2023-08-04 DIAGNOSIS — I714 Abdominal aortic aneurysm, without rupture, unspecified: Secondary | ICD-10-CM

## 2023-08-04 DIAGNOSIS — I10 Essential (primary) hypertension: Secondary | ICD-10-CM | POA: Diagnosis not present

## 2023-08-04 DIAGNOSIS — I2510 Atherosclerotic heart disease of native coronary artery without angina pectoris: Secondary | ICD-10-CM | POA: Diagnosis not present

## 2023-08-04 DIAGNOSIS — Z800 Family history of malignant neoplasm of digestive organs: Secondary | ICD-10-CM | POA: Insufficient documentation

## 2023-08-04 DIAGNOSIS — K429 Umbilical hernia without obstruction or gangrene: Secondary | ICD-10-CM | POA: Insufficient documentation

## 2023-08-04 DIAGNOSIS — E669 Obesity, unspecified: Secondary | ICD-10-CM | POA: Insufficient documentation

## 2023-08-04 DIAGNOSIS — E782 Mixed hyperlipidemia: Secondary | ICD-10-CM | POA: Diagnosis not present

## 2023-08-04 NOTE — Patient Instructions (Signed)
Medication Instructions:  The current medical regimen is effective;  continue present plan and medications as directed. Please refer to the Current Medication list given to you today.  *If you need a refill on your cardiac medications before your next appointment, please call your pharmacy*  Lab Work: NONE If you have labs (blood work) drawn today and your tests are completely normal, you will receive your results only by:  MyChart Message (if you have MyChart) OR  A paper copy in the mail If you have any lab test that is abnormal or we need to change your treatment, we will call you to review the results.  Other Instructions INCREASE PHYSICAL ACTIVITY AS TOLERATED PLEASE READ AND FOLLOW ATTACHED  SALTY 6   Follow-Up: At [facility], you and your health needs are our priority.  As part of our continuing mission to provide you with exceptional heart care, we have created designated Provider Care Teams.  These Care Teams include your primary Cardiologist (physician) and Advanced Practice Providers (APPs -  Physician Assistants and Nurse Practitioners) who all work together to provide you with the care you need, when you need it.  Your next appointment:   9-12 month(s)  Provider:   Olga Millers, MD  or Edd Fabian, FNP

## 2023-08-06 ENCOUNTER — Encounter (HOSPITAL_COMMUNITY): Payer: Self-pay

## 2023-08-06 NOTE — Anesthesia Preprocedure Evaluation (Addendum)
 Anesthesia Evaluation  Patient identified by MRN, date of birth, ID band Patient awake    Reviewed: Allergy & Precautions, NPO status , Patient's Chart, lab work & pertinent test results  History of Anesthesia Complications Negative for: history of anesthetic complications  Airway Mallampati: III  TM Distance: >3 FB Neck ROM: Full    Dental  (+) Dental Advisory Given, Teeth Intact   Pulmonary neg pulmonary ROS   Pulmonary exam normal        Cardiovascular hypertension, + CAD  Normal cardiovascular exam     Neuro/Psych negative neurological ROS  negative psych ROS   GI/Hepatic negative GI ROS, Neg liver ROS,,,  Endo/Other   Obesity   Renal/GU negative Renal ROS     Musculoskeletal  (+) Arthritis ,    Abdominal   Peds  Hematology negative hematology ROS (+)   Anesthesia Other Findings   Reproductive/Obstetrics                             Anesthesia Physical Anesthesia Plan  ASA: 3  Anesthesia Plan: General   Post-op Pain Management: Tylenol PO (pre-op)* and Regional block*   Induction: Intravenous  PONV Risk Score and Plan: 2 and Treatment may vary due to age or medical condition, Ondansetron and Dexamethasone  Airway Management Planned: Oral ETT  Additional Equipment: None  Intra-op Plan:   Post-operative Plan: Extubation in OR  Informed Consent: I have reviewed the patients History and Physical, chart, labs and discussed the procedure including the risks, benefits and alternatives for the proposed anesthesia with the patient or authorized representative who has indicated his/her understanding and acceptance.     Dental advisory given  Plan Discussed with: CRNA and Anesthesiologist  Anesthesia Plan Comments: (See PAT note from 2/17 by Sherlie Ban PA-C )        Anesthesia Quick Evaluation

## 2023-08-06 NOTE — Progress Notes (Signed)
Case: [id number] Date/Time: 08/10/23 1445   Procedure: XI ROBOT ASSISTED UMBILICAL HERNIA REPAIR WITH MESH - 150 GEN AND TAP BLOCK   Anesthesia type: General   Pre-op diagnosis: UMBILICAL HERNIA   Location: [facility] ROOM 05 / [facility]   Surgeons: Moise Boring, MD       DISCUSSION: Christopher Whitaker is a 68 year old male who presents to PAT prior to surgery above.  Past medical history significant for hypertension, CAD (by CT scan), ectatic abdominal aorta, arthritis.  Patient follows with cardiology due to CAD. Calcium score July 2022 673 (LAD 563) which was 87 percentile. Nuclear study November 2022 showed no ischemia and ejection fraction 52%.  Abdominal ultrasound December 2022 showed ectatic abdominal aorta at 2.8 cm and left common iliac of 1.7 cm.  Last seen in clinic on 04/14/2023 and noted to be doing well.  Tolerating medicines.  Seen for preop clearance on 08/04/2023 and cleared for surgery:  "Chart reviewed as part of pre-operative protocol coverage. Given past medical history and time since last visit, based on ACC/AHA guidelines, Christopher Whitaker would be at acceptable risk for the planned procedure without further cardiovascular testing.    His aspirin may be held for 5 to 7 days prior to his procedure. Please resume as soon as hemostasis is achieved.    His RCRI is low risk, 0.9% risk of major cardiac event.  He is able to complete greater than 4 METS of physical activity."    VS: BP (!) 134/99   Pulse 100   Temp 36.9 C (Oral)   Ht 5\' 10"  (1.778 m)   Wt 104.3 kg   SpO2 99%   BMI 33.00 kg/m   PROVIDERS: Merri Brunette, MD Cardiologist - Jens Som Madolyn Frieze, MD  LABS: Labs reviewed: Acceptable for surgery. (all labs ordered are listed, but only abnormal results are displayed)  Labs Reviewed  CBC  BASIC METABOLIC PANEL     IMAGES:   EKG 08/04/2023:  Sinus tachycardia, rate 103 When compared with ECG of 14-Apr-2023 09:20, No significant change was found  CV:  CT  calcium score 12/14/2020:  IMPRESSION: Coronary calcium score of 673 is at the 87th percentile for the patient's age, sex and race peer  Ultrasound AAA 04/14/2023:  Summary: Abdominal Aorta: Mid abdominal aorta ectasia. The largest aortic measurement is 2.8 cm. Previous diameter measurement was 2.8 cm obtained on 05/17/2021. The left common iliac artery is ectatic, with stable dimensions at 1.7 cm.  Stenosis: Widely patent bilateral common and external iliac arteries without evidence of focal stenosis.  IVC/Iliac: Patent IVC.  Stress test 05/06/2021:    The study is normal. The study is low risk.   Fair exercise capacity, achieved 8.5 METS   Max HR 146 bpm (94% max age predicted HR)   Normal BP response to exercise   No ST deviation was noted.   LV perfusion is normal. There is no evidence of ischemia. There is no evidence of infarction.   Left ventricular function is abnormal. Global function is mildly reduced. Nuclear stress EF: 52 %. The left ventricular ejection fraction is mildly decreased (45-54%). End diastolic cavity size is normal. End systolic cavity size is normal.   Prior study not available for comparison.   Past Medical History:  Diagnosis Date   Arthritis    CAD (coronary artery disease)    Hernia, umbilical    Hyperlipidemia    Hypertension     Past Surgical History:  Procedure Laterality Date   COLONOSCOPY  KNEE SURGERY Right    meniscus repair    MEDICATIONS:  ASCORBIC ACID PO   aspirin EC 81 MG tablet   B Complex-C (B-COMPLEX WITH VITAMIN C) tablet   calcium carbonate (TUMS - DOSED IN MG ELEMENTAL CALCIUM) 500 MG chewable tablet   ezetimibe (ZETIA) 10 MG tablet   guaiFENesin-codeine 100-10 MG/5ML syrup   meloxicam (MOBIC) 7.5 MG tablet   Multiple Vitamin (MULTIVITAMIN WITH MINERALS) TABS tablet   Omega-3 Fatty Acids (FISH OIL PO)   rosuvastatin (CRESTOR) 40 MG tablet   No current facility-administered medications for this encounter.     Marcille Blanco [facility] Short Stay/Anesthesiology [facility] Phone [phone] 08/06/2023 9:27 AM

## 2023-08-10 ENCOUNTER — Ambulatory Visit (HOSPITAL_COMMUNITY): Payer: Medicare HMO | Admitting: Medical

## 2023-08-10 ENCOUNTER — Encounter (HOSPITAL_COMMUNITY)
Admission: RE | Disposition: A | Discharge status: Home / Self Care | Payer: Self-pay | Source: Home / Self Care | Attending: General Surgery

## 2023-08-10 ENCOUNTER — Encounter (HOSPITAL_COMMUNITY): Payer: Self-pay | Admitting: General Surgery

## 2023-08-10 ENCOUNTER — Other Ambulatory Visit: Payer: Self-pay

## 2023-08-10 ENCOUNTER — Ambulatory Visit (HOSPITAL_COMMUNITY)
Admission: RE | Admit: 2023-08-10 | Discharge: 2023-08-10 | Disposition: A | Discharge status: Home / Self Care | Payer: Medicare HMO | Attending: General Surgery | Admitting: General Surgery

## 2023-08-10 ENCOUNTER — Ambulatory Visit (HOSPITAL_COMMUNITY): Payer: Medicare HMO | Admitting: Anesthesiology

## 2023-08-10 DIAGNOSIS — E669 Obesity, unspecified: Secondary | ICD-10-CM | POA: Diagnosis not present

## 2023-08-10 DIAGNOSIS — I10 Essential (primary) hypertension: Secondary | ICD-10-CM | POA: Diagnosis not present

## 2023-08-10 DIAGNOSIS — M1990 Unspecified osteoarthritis, unspecified site: Secondary | ICD-10-CM | POA: Insufficient documentation

## 2023-08-10 DIAGNOSIS — I2510 Atherosclerotic heart disease of native coronary artery without angina pectoris: Secondary | ICD-10-CM

## 2023-08-10 DIAGNOSIS — Z6832 Body mass index (BMI) 32.0-32.9, adult: Secondary | ICD-10-CM | POA: Insufficient documentation

## 2023-08-10 DIAGNOSIS — K436 Other and unspecified ventral hernia with obstruction, without gangrene: Secondary | ICD-10-CM | POA: Diagnosis present

## 2023-08-10 DIAGNOSIS — K420 Umbilical hernia with obstruction, without gangrene: Secondary | ICD-10-CM | POA: Diagnosis not present

## 2023-08-10 DIAGNOSIS — K439 Ventral hernia without obstruction or gangrene: Secondary | ICD-10-CM

## 2023-08-10 DIAGNOSIS — G8918 Other acute postprocedural pain: Secondary | ICD-10-CM | POA: Diagnosis not present

## 2023-08-10 SURGERY — REPAIR, HERNIA, UMBILICAL, ROBOT-ASSISTED
Anesthesia: General

## 2023-08-10 MED ORDER — GABAPENTIN 300 MG PO CAPS
300.0000 mg | ORAL_CAPSULE | ORAL | Status: AC
  Administered 2023-08-10: 300 mg via ORAL
  Filled 2023-08-10: qty 1

## 2023-08-10 MED ORDER — DEXAMETHASONE SODIUM PHOSPHATE 10 MG/ML IJ SOLN
INTRAMUSCULAR | Status: DC | PRN
Start: 2023-08-10 — End: 2023-08-10
  Administered 2023-08-10: 8 mg via INTRAVENOUS

## 2023-08-10 MED ORDER — PROPOFOL 10 MG/ML IV BOLUS
INTRAVENOUS | Status: AC
  Filled 2023-08-10: qty 20

## 2023-08-10 MED ORDER — OXYCODONE HCL 5 MG/5ML PO SOLN: 5.0000 mg | Freq: Once | ORAL | Status: DC | PRN

## 2023-08-10 MED ORDER — OXYCODONE HCL 5 MG PO TABS: 5.0000 mg | ORAL_TABLET | Freq: Once | ORAL | Status: DC | PRN

## 2023-08-10 MED ORDER — FENTANYL CITRATE PF 50 MCG/ML IJ SOSY: 25.0000 ug | PREFILLED_SYRINGE | INTRAMUSCULAR | Status: DC | PRN

## 2023-08-10 MED ORDER — ACETAMINOPHEN 500 MG PO TABS: 1000.0000 mg | ORAL_TABLET | Freq: Once | ORAL | Status: DC

## 2023-08-10 MED ORDER — SODIUM CHLORIDE 0.9% FLUSH: 3.0000 mL | Freq: Two times a day (BID) | INTRAVENOUS | Status: DC

## 2023-08-10 MED ORDER — LACTATED RINGERS IV SOLN: INTRAVENOUS | Status: DC | PRN

## 2023-08-10 MED ORDER — BUPIVACAINE LIPOSOME 1.3 % IJ SUSP
INTRAMUSCULAR | Status: DC | PRN
  Administered 2023-08-10: 10 mL via PERINEURAL

## 2023-08-10 MED ORDER — PROPOFOL 10 MG/ML IV BOLUS
INTRAVENOUS | Status: DC | PRN
  Administered 2023-08-10: 150 mg via INTRAVENOUS
  Administered 2023-08-10 (×2): 50 mg via INTRAVENOUS

## 2023-08-10 MED ORDER — FENTANYL CITRATE PF 50 MCG/ML IJ SOSY
50.0000 ug | PREFILLED_SYRINGE | INTRAMUSCULAR | Status: DC
  Administered 2023-08-10: 100 ug via INTRAVENOUS
  Filled 2023-08-10 (×2): qty 2

## 2023-08-10 MED ORDER — ONDANSETRON HCL 4 MG/2ML IJ SOLN: 4.0000 mg | Freq: Once | INTRAMUSCULAR | Status: DC | PRN

## 2023-08-10 MED ORDER — LACTATED RINGERS IV SOLN: INTRAVENOUS | Status: DC

## 2023-08-10 MED ORDER — ORAL CARE MOUTH RINSE: 15.0000 mL | Freq: Once | OROMUCOSAL | Status: AC

## 2023-08-10 MED ORDER — OXYCODONE HCL 5 MG PO TABS: 5.0000 mg | ORAL_TABLET | ORAL | Status: DC | PRN

## 2023-08-10 MED ORDER — CHLORHEXIDINE GLUCONATE CLOTH 2 % EX PADS: 6.0000 | MEDICATED_PAD | Freq: Once | CUTANEOUS | Status: DC

## 2023-08-10 MED ORDER — PHENYLEPHRINE HCL-NACL 20-0.9 MG/250ML-% IV SOLN
INTRAVENOUS | Status: DC | PRN
  Administered 2023-08-10: 80 ug via INTRAVENOUS

## 2023-08-10 MED ORDER — BUPIVACAINE LIPOSOME 1.3 % IJ SUSP: 20.0000 mL | Freq: Once | INTRAMUSCULAR | Status: DC

## 2023-08-10 MED ORDER — LIDOCAINE HCL (CARDIAC) PF 100 MG/5ML IV SOSY
PREFILLED_SYRINGE | INTRAVENOUS | Status: DC | PRN
Start: 2023-08-10 — End: 2023-08-10
  Administered 2023-08-10: 50 mg via INTRATRACHEAL

## 2023-08-10 MED ORDER — FENTANYL CITRATE (PF) 100 MCG/2ML IJ SOLN
INTRAMUSCULAR | Status: AC
  Filled 2023-08-10: qty 2

## 2023-08-10 MED ORDER — BUPIVACAINE HCL (PF) 0.25 % IJ SOLN
INTRAMUSCULAR | Status: DC | PRN
  Administered 2023-08-10: 30 mL via PERINEURAL

## 2023-08-10 MED ORDER — CHLORHEXIDINE GLUCONATE 0.12 % MT SOLN
15.0000 mL | Freq: Once | OROMUCOSAL | Status: AC
  Administered 2023-08-10: 15 mL via OROMUCOSAL

## 2023-08-10 MED ORDER — 0.9 % SODIUM CHLORIDE (POUR BTL) OPTIME
TOPICAL | Status: DC | PRN
  Administered 2023-08-10: 1000 mL

## 2023-08-10 MED ORDER — BUPIVACAINE-EPINEPHRINE 0.5% -1:200000 IJ SOLN
INTRAMUSCULAR | Status: DC | PRN
Start: 2023-08-10 — End: 2023-08-11
  Administered 2023-08-10: 30 mL

## 2023-08-10 MED ORDER — MORPHINE SULFATE (PF) 2 MG/ML IV SOLN: 1.0000 mg | INTRAVENOUS | Status: DC | PRN

## 2023-08-10 MED ORDER — BUPIVACAINE-EPINEPHRINE (PF) 0.5% -1:200000 IJ SOLN
INTRAMUSCULAR | Status: AC
  Filled 2023-08-10: qty 30

## 2023-08-10 MED ORDER — KETAMINE HCL 50 MG/5ML IJ SOSY
PREFILLED_SYRINGE | INTRAMUSCULAR | Status: AC
  Filled 2023-08-10: qty 5

## 2023-08-10 MED ORDER — SODIUM CHLORIDE 0.9% FLUSH: 3.0000 mL | INTRAVENOUS | Status: DC | PRN

## 2023-08-10 MED ORDER — DEXAMETHASONE SODIUM PHOSPHATE 10 MG/ML IJ SOLN
INTRAMUSCULAR | Status: AC
  Filled 2023-08-10: qty 1

## 2023-08-10 MED ORDER — IBUPROFEN 200 MG PO TABS: 600.0000 mg | ORAL_TABLET | Freq: Four times a day (QID) | ORAL | 0 refills | Status: AC

## 2023-08-10 MED ORDER — PHENYLEPHRINE 80 MCG/ML (10ML) SYRINGE FOR IV PUSH (FOR BLOOD PRESSURE SUPPORT)
PREFILLED_SYRINGE | INTRAVENOUS | Status: AC
  Filled 2023-08-10: qty 10

## 2023-08-10 MED ORDER — FENTANYL CITRATE (PF) 250 MCG/5ML IJ SOLN
INTRAMUSCULAR | Status: DC | PRN
  Administered 2023-08-10: 100 ug via INTRAVENOUS
  Administered 2023-08-10 (×2): 50 ug via INTRAVENOUS

## 2023-08-10 MED ORDER — ONDANSETRON HCL 4 MG/2ML IJ SOLN
INTRAMUSCULAR | Status: AC
  Filled 2023-08-10: qty 2

## 2023-08-10 MED ORDER — ACETAMINOPHEN 325 MG PO TABS
650.0000 mg | ORAL_TABLET | ORAL | Status: DC | PRN
Start: 2023-08-10 — End: 2023-08-11

## 2023-08-10 MED ORDER — ACETAMINOPHEN 325 MG PO TABS: 650.0000 mg | ORAL_TABLET | Freq: Four times a day (QID) | ORAL | 0 refills | Status: AC

## 2023-08-10 MED ORDER — LIDOCAINE HCL (PF) 2 % IJ SOLN
INTRAMUSCULAR | Status: AC
  Filled 2023-08-10: qty 5

## 2023-08-10 MED ORDER — CEFAZOLIN SODIUM-DEXTROSE 2-4 GM/100ML-% IV SOLN
2.0000 g | INTRAVENOUS | Status: AC
Start: 2023-08-10 — End: 2023-08-10
  Administered 2023-08-10: 2 g via INTRAVENOUS
  Filled 2023-08-10: qty 100

## 2023-08-10 MED ORDER — SODIUM CHLORIDE 0.9 % IV SOLN: 250.0000 mL | INTRAVENOUS | Status: DC | PRN

## 2023-08-10 MED ORDER — MIDAZOLAM HCL 2 MG/2ML IJ SOLN
INTRAMUSCULAR | Status: AC
  Filled 2023-08-10: qty 2

## 2023-08-10 MED ORDER — ROCURONIUM BROMIDE 10 MG/ML (PF) SYRINGE
PREFILLED_SYRINGE | INTRAVENOUS | Status: AC
  Filled 2023-08-10: qty 10

## 2023-08-10 MED ORDER — MIDAZOLAM HCL 2 MG/2ML IJ SOLN
1.0000 mg | INTRAMUSCULAR | Status: DC
  Administered 2023-08-10: 2 mg via INTRAVENOUS
  Filled 2023-08-10 (×2): qty 2

## 2023-08-10 MED ORDER — ONDANSETRON HCL 4 MG/2ML IJ SOLN
INTRAMUSCULAR | Status: DC | PRN
  Administered 2023-08-10: 4 mg via INTRAVENOUS

## 2023-08-10 MED ORDER — MIDAZOLAM HCL 2 MG/2ML IJ SOLN
INTRAMUSCULAR | Status: DC | PRN
  Administered 2023-08-10: 2 mg via INTRAVENOUS

## 2023-08-10 MED ORDER — SUGAMMADEX SODIUM 200 MG/2ML IV SOLN
INTRAVENOUS | Status: DC | PRN
  Administered 2023-08-10: 200 mg via INTRAVENOUS

## 2023-08-10 MED ORDER — OXYCODONE HCL 5 MG PO TABS: 5.0000 mg | ORAL_TABLET | Freq: Three times a day (TID) | ORAL | 0 refills | Status: AC | PRN

## 2023-08-10 MED ORDER — ACETAMINOPHEN 650 MG RE SUPP
650.0000 mg | RECTAL | Status: DC | PRN
Start: 2023-08-10 — End: 2023-08-11

## 2023-08-10 MED ORDER — KETAMINE HCL 50 MG/5ML IJ SOSY
PREFILLED_SYRINGE | INTRAMUSCULAR | Status: DC | PRN
  Administered 2023-08-10: 30 mg via INTRAVENOUS
  Administered 2023-08-10: 20 mg via INTRAVENOUS

## 2023-08-10 MED ORDER — ACETAMINOPHEN 500 MG PO TABS
1000.0000 mg | ORAL_TABLET | ORAL | Status: AC
  Administered 2023-08-10: 1000 mg via ORAL
  Filled 2023-08-10: qty 2

## 2023-08-10 MED ORDER — ROCURONIUM BROMIDE 100 MG/10ML IV SOLN
INTRAVENOUS | Status: DC | PRN
  Administered 2023-08-10: 80 mg via INTRAVENOUS
  Administered 2023-08-10: 20 mg via INTRAVENOUS

## 2023-08-10 SURGICAL SUPPLY — 53 items
ANTIFOG SOL W/FOAM PAD STRL (MISCELLANEOUS) ×1
APPLICATOR COTTON TIP 6 STRL (MISCELLANEOUS) IMPLANT
APPLICATOR COTTON TIP 6IN STRL (MISCELLANEOUS)
BAG COUNTER SPONGE SURGICOUNT (BAG) IMPLANT
BLADE SURG SZ11 CARB STEEL (BLADE) ×1 IMPLANT
CHLORAPREP W/TINT 26 (MISCELLANEOUS) ×1 IMPLANT
COVER MAYO STAND STRL (DRAPES) ×1 IMPLANT
COVER SURGICAL LIGHT HANDLE (MISCELLANEOUS) ×1 IMPLANT
COVER TIP SHEARS 8 DVNC (MISCELLANEOUS) ×1 IMPLANT
DERMABOND ADVANCED .7 DNX12 (GAUZE/BANDAGES/DRESSINGS) ×1 IMPLANT
DRAPE ARM DVNC X/XI (DISPOSABLE) ×3 IMPLANT
DRAPE COLUMN DVNC XI (DISPOSABLE) ×1 IMPLANT
DRIVER NDL MEGA SUTCUT DVNCXI (INSTRUMENTS) ×1 IMPLANT
DRIVER NDLE MEGA SUTCUT DVNCXI (INSTRUMENTS) ×1
ELECT REM PT RETURN 15FT ADLT (MISCELLANEOUS) ×1 IMPLANT
FORCEPS BPLR 8 MD DVNC XI (FORCEP) ×1 IMPLANT
GAUZE 4X4 16PLY ~~LOC~~+RFID DBL (SPONGE) ×1 IMPLANT
GLOVE BIO SURGEON STRL SZ7 (GLOVE) ×2 IMPLANT
GOWN STRL REUS W/ TWL XL LVL3 (GOWN DISPOSABLE) ×2 IMPLANT
IRRIG SUCT STRYKERFLOW 2 WTIP (MISCELLANEOUS)
IRRIGATION SUCT STRKRFLW 2 WTP (MISCELLANEOUS) IMPLANT
KIT BASIN OR (CUSTOM PROCEDURE TRAY) ×1 IMPLANT
KIT TURNOVER KIT A (KITS) IMPLANT
MARKER SKIN DUAL TIP RULER LAB (MISCELLANEOUS) ×1 IMPLANT
MESH VENTRALIGHT ST 4.5IN (Mesh General) IMPLANT
NDL HYPO 22X1.5 SAFETY MO (MISCELLANEOUS) ×1 IMPLANT
NDL INSUFFLATION 14GA 120MM (NEEDLE) ×1 IMPLANT
NEEDLE HYPO 22X1.5 SAFETY MO (MISCELLANEOUS) ×1
NEEDLE INSUFFLATION 14GA 120MM (NEEDLE) ×1
OBTURATOR OPTICAL STND 8 DVNC (TROCAR) ×1
OBTURATOR OPTICALSTD 8 DVNC (TROCAR) ×1 IMPLANT
PACK CARDIOVASCULAR III (CUSTOM PROCEDURE TRAY) ×1 IMPLANT
SCISSORS MNPLR CVD DVNC XI (INSTRUMENTS) ×1 IMPLANT
SEAL UNIV 5-12 XI (MISCELLANEOUS) ×3 IMPLANT
SOL ELECTROSURG ANTI STICK (MISCELLANEOUS) ×1
SOLUTION ANTFG W/FOAM PAD STRL (MISCELLANEOUS) ×1 IMPLANT
SOLUTION ELECTROSURG ANTI STCK (MISCELLANEOUS) ×1 IMPLANT
SPIKE FLUID TRANSFER (MISCELLANEOUS) ×1 IMPLANT
SUT MNCRL AB 4-0 PS2 18 (SUTURE) ×1 IMPLANT
SUT STRATA PDS 2-0 23 CT-1 (SUTURE) IMPLANT
SUT STRATAFIX PDS+0 CT1 9 (SUTURE) IMPLANT
SUT STRATAFIX SPIRAL PDS3-0 (SUTURE) ×1 IMPLANT
SUT VIC AB 2-0 SH 27X BRD (SUTURE) ×1 IMPLANT
SUT VIC AB 3-0 SH 18 (SUTURE) IMPLANT
SUT VIC AB 3-0 SH 27X BRD (SUTURE) IMPLANT
SUT VLOC 180 2-0 9IN GS21 (SUTURE) IMPLANT
SYR 10ML LL (SYRINGE) ×1 IMPLANT
SYR 20ML LL LF (SYRINGE) ×1 IMPLANT
TAPE STRIPS DRAPE STRL (GAUZE/BANDAGES/DRESSINGS) ×1 IMPLANT
TOWEL GREEN STERILE FF (TOWEL DISPOSABLE) ×1 IMPLANT
TOWEL OR 17X26 10 PK STRL BLUE (TOWEL DISPOSABLE) ×1 IMPLANT
TROCAR Z-THREAD OPTICAL 5X100M (TROCAR) IMPLANT
TUBING INSUFFLATION 10FT LAP (TUBING) ×1 IMPLANT

## 2023-08-10 NOTE — Transfer of Care (Signed)
 Immediate Anesthesia Transfer of Care Note  Patient: Christopher Whitaker  Procedure(s) Performed: XI ROBOT ASSISTED UMBILICAL HERNIA REPAIR WITH MESH  Patient Location: PACU  Anesthesia Type:General  Level of Consciousness: sedated and responds to stimulation  Airway & Oxygen Therapy: Patient Spontanous Breathing and Patient connected to face mask oxygen  Post-op Assessment: Report given to RN  Post vital signs: stable  Last Vitals:  Vitals Value Taken Time  BP 148/90 08/10/23 1839  Temp    Pulse 76 08/10/23 1843  Resp 17 08/10/23 1843  SpO2 100 % 08/10/23 1843  Vitals shown include unfiled device data.  Last Pain:  Vitals:   08/10/23 1303  PainSc: 0-No pain         Complications: No notable events documented.

## 2023-08-10 NOTE — Op Note (Signed)
 Christopher Whitaker ([id number])  Operative Report  Date 08/10/2023   PREOPERATIVE DIAGNOSIS: Ventral hernia (2cm x 2cm)  POSTOPERATIVE DIAGNOSIS: Ventral hernia (2cm x 2cm)  PROCEDURE:  Robotic Ventral Hernia Repair with Intra-Peritoneal Onlay Mesh   HERNIA CHARACTERISTICS: Location: umbilical Approach: Robotic Initial/Recurrent: Initial Reducible/Incarcerated: strangulated Mesh Implantation: Yes- 11 cm Ventralight  Hernia Size: 2cm x 2cm  IMPLANTS: Implant Name Type Inv. Item Serial No. Manufacturer Lot No. LRB No. Used Action  MESH VENTRALIGHT ST 4.Sharyn Dross Mesh General MESH VENTRALIGHT ST 4.5IN  [employer] BARD ACCESS [id number] N/A 1 Implanted    SURGEON: Melody Haver, MD  ANESTHESIA: General Anesthesia    INTRAOPERATIVE FINDINGS: Strangulated fat within the umbilical hernia.  ESTIMATED BLOOD LOSS: Minimal  COMPLICATIONS: None  SPECIMENS: None  OPERATIVE INDICATIONS: Pt is a 68 y.o. male who presents with an umbilical hernia.  The patient desires definitive repair.  The risks, benefits and alternatives of the procedure were explained to the patient.  Risks including the risks of bleeding, infection, need for mesh removal, and potential of hernia recurrence were discussed.  The patient agreed to proceed and signed informed consent in front of a witness.   DESCRIPTION OF PROCEDURE: After preoperative identifying the patient in holding, the patient was brought to the operating room and placed supine on the operating table. SCDs were placed.  After induction of general anesthesia, the patient was given the appropriate perioperative antibiotics.  Both arms were tucked and padded to avoid potential nerve injury.  The abdomen was prepped and draped in a typical sterile fashion.  A JACHO approved time out, where the name of the patient, the operation, and intended site was confirmed.  The procedure was begun.  An incision was made in the left upper quadrant and the Veress needle  was introduced into the abdomen under standard drop technique.  The abdomen was insufflated with air.  An 8 mm left upper quadrant optical trocar was placed under direct visualization and the abdomen was inspected.  No evidence of injury was evident at the trocar site or secondary to the Veress needle.  Two additional 8 mm ports were placed along the left abdomen under direct visualization.  The daVinci robot was brought into the field and docked.  A 30 degree scope was placed in the left upper quadrant port.  Graspers were placed in the left flank port and dissection scissors were placed in the upper LUQ port.  The hernia was inspected.  The hernia was noted to contain omentum without evidence of bowel. The hernia was gently reduced using blunt dissection. The reduced omentum was inspected for hemostasis.  I attempted to create a pre-peritoneal plane, however, near the hernia itself the tissues were very indurated and I decided to abort this dissection and proceed with an intra-peritoneal onlay mesh repair.  I evaluated the defect and found it be 2cm x 2cm.  After defining our defect, the defect was closed primarily with a running #1 Stratafix Symmetric Suture.  Next, a 11 cm round piece of Ventralight mesh was introduced in the abdomen.  The mesh was aligned appropriately with the defect to provide adequate coverage in all directions.  There was good coverage of the defect at completion and the coated side was noted to be facing intra-peritoneal.  The mesh was sutured to the abdominal wall using two running 2-0 v-lock suture. The abdomen was thoroughly inspected a final time and hemostasis was assured.  Any suture needles were removed from the body.  The robot was undocked from the patient.  The remaining ports were removed and the abdomen was desulfated. On inspection of the abdomen I noted that a firm mass was still present at the site of her umbilicus. I was concerned that he had fat that had separated from the  remainder of the hernia contents when it became strangulated. I made an incision within the umbilicus using a #15 blade.  I soon encountered a moderate sized wad of saponified fat.  The was dissected free from surrounding tissues and excised.  The skin over the umbilicus was approximated to the abdominal wall using 3-0 vicryl.  The umbilical wound irrigated and inspected for hemostasis. The umbilical wound was closed using interrupted 3-0 vicryl and a running 4-0 monocryl.  The port sites were closed with interrupted 4-0 monocryl, local anesthetic was infiltrated, and Dermabond applied.  After confirming twice that the sponge, needle, and instrument counts were correct, the procedure was terminated, the patient was extubated and transferred to the recovery room in stable condition.  DISPOSITION: Stable to PACU.   PROCEDURAL BILLING:  Procedures:   * XI ROBOT ASSISTED UMBILICAL HERNIA REPAIR WITH MESH  Electronically Signed By: Moise Boring 08/10/2023 6:27 PM

## 2023-08-10 NOTE — Progress Notes (Signed)
 Patient blocked in abdomen with Dr. Malen Gauze and Dr. Salvadore Farber, this RN present during entirety of block, and monitoring vitals post procedure.

## 2023-08-10 NOTE — Discharge Instructions (Signed)
 Home Care After Hernia Repair  Activity  Limit activity for the first 24 hours, then you may return to normal daily activities. Returning to normal daily activities as soon as you can following surgery will enhance recovery time.  No heavy lifting pushing or pulling, anything heavier than 10 pounds (gallon of milk weighs approx. 8.8 pounds) for 4-6 weeks from surgery date.  Do not mow the lawn, use a vacuum cleaner, or do any other strenuous activities without first consulting your surgical team.  Climb stairs slowly and watch your step.  Walk as often as you feel able to increase strength and endurance.  No driving or operating heavy machinery within 24 hours of taking narcotic pain medication.  Diet  Drink plenty of fluids and eat a light meal on the night of surgery. Some patients may find their appetite is poor for a week or two after surgery. This is a normal result of the stress of surgery-your appetite will return in time.   There are no specific diet restrictions after surgery.  Dressings and Wound Care  Dermabond/Durabond (skin glue): This will usually remain in place for 10-14 days, then naturally fall off your skin. You may take a shower 24 hrs after  surgery, carefully wash, not scrub the incision site with a mild non-scented soap. Pat dry with a soft towel.  Do not pick or peel skin glue off.  You can shower and let the water fall on the dressings above. Do not soak or submerge your incision(s) in a bath tub, hot tub, or swimming pool, until your doctor says it is ok to do so or the incision(s) have completely healed, usually about 2-4 weeks.  Do not use creams, powder, salves or balms on your incision(s).  What to Expect After Surgery   Moderate discomfort controlled with medications  Minimal drainage from incision  You may feel pain in one or both shoulders. This pain comes from the gas still left in your belly after the surgery, if you had laparoscopic surgery (several small  incisions). The pain should ease over several days to a week. Ambulation will help with this pain.   Belly swelling  Feeling fatigue and weak  Constipation after surgery is common. Drink plenty fluids and eat a high fiber diet.  Swelling - In some patients might feel that their hernia has returned after surgery-DO NOT Worry this is normal. Swelling may be due to the development of a seroma. Seroma is fluid that has built up where the hernia was repaired this is a normal result after surgery and it will slowly reabsorb back into your body over the next several weeks.   Pain Control: Prescribed Non-Narcotic Pain Medication  You will be given three prescriptions.  Two of them will be for prescription strength ibuprofen (i.e. Advil) and prescription strength acetaminophen (i.e. Tylenol).  The vast majority of patients will just need these two medications.  One prescription will be for a 'rescue' prescription of an oral narcotic (oxycodone).  You may fill this if needed.  You will alternate taking the ibuprofen (600mg ) every 6 hours and also the acetaminophen (650mg ) every 6 hours so that you are taking one of those medications every 3 hours.  For example: o 0800 - take ibuprofen 600mg  o 1100 - take acetaminophen 650mg  o 1400 - take ibuprofen 600mg  o 1700 - take acetaminophen 650mg  o Etc.  Continue taking this alternating pattern of ibuprofen and acetaminophen for 3 days  If you cannot take one  or the other of these medications, just take the one you can every 6 hours.  If you are comfortable at night, you don't have to wake up and take a medication.  If you are still uncomfortable after taking either ibuprofen or acetaminophen, try gentle stretching exercise and ice packs (a bag of frozen vegetables works great).  If you are still uncomfortable, you may fill the narcotic prescription of Oxycodone and take as directed.  Once you have completed these prescriptions, your pain level should be low enough  to stop taking medications altogether or just use an over the counter medication (ibuprofen or acetaminophen) as needed.   Pain Control: Over the Counter Medications to take as needed:  Colace/Docusate: May be prescribed by your surgeon to prevent constipation caused by the combination of narcotics, effects of anesthesia, and decreased ambulation.  Hold for loose stools or diarrhea. Take 100 mg 1-2 times a day starting tonight.   Fiber: High fiber foods, extra liquids (water 9-13 cups/day) can also assist with constipation. Examples of high fiber foods are fruit, bran. Prune juice and water are also good liquids to drink.  Milk of Magnesia/Miralax:  If constipated despite take the over the counter stool softeners, you may take Milk of Magnesia or Miralax as directed on bottle to assist with constipation.     Pepcid/Famotidine: May be prescribed while taking naproxen (Aleve) or other NSAIDs such as ibuprofen (Motrin/Advil) to prevent stomach upset or Acid-reflux symptoms. Take 1 tablet 1-2 times a day.   **Constipation: The first bowel movement may occur anywhere between 1-5 days after surgery.  As long as you are not nauseated or not having significant abdominal pain this variation is acceptable.  Narcotic pain medications can cause constipation increasing discomfort; early discontinuation will assist with bowel management.If constipated despite taking stool softeners, you may take Milk of Magnesia or Miralax as directed on the bottle.     **Home medications: You may restart your home medications as directed by your respective Primary Care Physician or Surgeon.  When to notify your Doctor or Healthcare Team:   Sign of Wound Infection   Fever over 100 degrees.  Wound becomes extremely swollen, shows red streaks, warm to the touch, and/or drainage from the incision site or foul-smelling drainage.  Wound edges separate or opens up  Bleeding or bruising   If you have bleeding, apply pressure to the  site and hold the pressure firmly for 5 minutes. If the bleeding continues, apply pressure again and call 911. If the bleeding stopped, call your doctor to report it.   Call your doctor or nurse if you have increased bleeding from your site and increased bruising or a lump forms or gets larger under your skin at the site.  Unrelieved Pain    Call your doctor or nurse if your pain gets worse or is not eased 1 hour after taking your pain medicine, or if it is severe and uncontrolled.  Nausea and Vomiting   Call your doctor or nurse if you have nausea and vomiting that continues more than 24 hours, will not let you keep medicine down and will not let you keep fluids down  Fever, Flu-like symptoms   Fever over 100 degrees and/or chills  Gastrointestinal Bleeding Symptoms    Black tarry bowel movements.  This can be normal after surgery on the stomach, but should resolve in a day or two.    Call 911 if you suddenly have signs of blood loss such as:  Vomiting blood  Fast heart rate  Feeling faint, sweaty, or blacking out  Passing bright red blood from your rectum  Blood Clot Symptoms   Tender, swollen or reddened areas in your calf muscle or thighs.  Numbness or tingling in your lower leg or calf, or at the top of your leg or groin  Skin on your leg looks pale or blue or feels cold to touch  Chest pain or have trouble breathing, lightheadedness, fast heart rate  Sudden Onset of Symptoms    Call 911 if you suddenly have:  Leg weakness and spasm  Loss of bladder or bowel function  Seizure  Confusion, severe headache, dizziness or feeling unsteady, problems talking, difficulty swallowing, and/or numbness or muscle weakness as these could be signs of a stroke.   Follow up Appointment Your follow up appointment should be scheduled 2-3 weeks after your surgery date.  If you have not previously scheduled for a follow-up visit you can be scheduled by contacting [phone].

## 2023-08-10 NOTE — Anesthesia Procedure Notes (Addendum)
 Anesthesia Regional Block: TAP block   Pre-Anesthetic Checklist: , timeout performed,  Correct Patient, Correct Site, Correct Laterality,  Correct Procedure, Correct Position, site marked,  Risks and benefits discussed,  Surgical consent,  Pre-op evaluation,  At surgeon's request and post-op pain management  Laterality: Left and Right  Prep: Maximum Sterile Barrier Precautions used, chloraprep       Needles:  Injection technique: Single-shot  Needle Type: Echogenic Stimulator Needle     Needle Length: 9cm  Needle Gauge: 22     Additional Needles:   Procedures:,,,, ultrasound used (permanent image in chart),,    Narrative:  Start time: 08/10/2023 3:00 PM End time: 08/10/2023 3:05 PM Injection made incrementally with aspirations every 5 mL.  Performed by: Personally  Anesthesiologist: Lannie Fields, DO  Additional Notes: Monitors applied. No increased pain on injection. No increased resistance to injection. Injection made in 5cc increments. Good needle visualization. Patient tolerated procedure well.   B/L rectus sheath blocks

## 2023-08-10 NOTE — Anesthesia Procedure Notes (Signed)
 Procedure Name: Intubation Date/Time: 08/10/2023 3:49 PM  Performed by: Micki Riley, CRNAPre-anesthesia Checklist: Patient identified, Emergency Drugs available, Suction available, Patient being monitored and Timeout performed Patient Re-evaluated:Patient Re-evaluated prior to induction Oxygen Delivery Method: Circle system utilized Preoxygenation: Pre-oxygenation with 100% oxygen Induction Type: IV induction Ventilation: Mask ventilation without difficulty Laryngoscope Size: Miller and 2 Grade View: Grade II Number of attempts: 1 Airway Equipment and Method: Stylet and Patient positioned with wedge pillow Secured at: 23 cm Tube secured with: Tape Dental Injury: Teeth and Oropharynx as per pre-operative assessment  Future Recommendations: Recommend- induction with short-acting agent, and alternative techniques readily available

## 2023-08-10 NOTE — H&P (Addendum)
 Christopher Whitaker [birth year]  [id number].    HPI:  68 y/o M who presents for elective umbilical hernia repair.  Since I last saw him in clinic he did experience an issue with the hernia becoming stuck. He chose not to present for evaluation and decided instead to monitor his symptoms at home. This was several weeks ago and since then he has remained stable and without evidence of obstruction.   ROS: Review of Systems  Constitutional: Negative.   HENT: Negative.    Eyes: Negative.   Respiratory: Negative.    Cardiovascular: Negative.   Gastrointestinal: Negative.   Genitourinary: Negative.   Musculoskeletal: Negative.   Skin: Negative.   Neurological: Negative.   Endo/Heme/Allergies: Negative.   Psychiatric/Behavioral: Negative.      Family History  Problem Relation Age of Onset   Aortic aneurysm Father     Past Medical History:  Diagnosis Date   Arthritis    CAD (coronary artery disease)    Hernia, umbilical    Hyperlipidemia    Hypertension     Past Surgical History:  Procedure Laterality Date   COLONOSCOPY     KNEE SURGERY Right    meniscus repair    Social History:  reports that he has never smoked. He does not have any smokeless tobacco history on file. He reports that he does not drink alcohol and does not use drugs.  Allergies: No Known Allergies  Medications Prior to Admission  Medication Sig Dispense Refill   ASCORBIC ACID PO Take 1 tablet by mouth 2 (two) times a week. (Patient not taking: Reported on 08/04/2023)     aspirin EC 81 MG tablet Take 1 tablet (81 mg total) by mouth daily. Swallow whole. 90 tablet 3   B Complex-C (B-COMPLEX WITH VITAMIN C) tablet Take 1 tablet by mouth 2 (two) times a week. (Patient not taking: Reported on 08/04/2023)     calcium carbonate (TUMS - DOSED IN MG ELEMENTAL CALCIUM) 500 MG chewable tablet Chew 1 tablet by mouth 2 (two) times daily as needed for indigestion or heartburn. (Patient not taking: Reported on 08/04/2023)      ezetimibe (ZETIA) 10 MG tablet Take 10 mg by mouth in the morning.     guaiFENesin-codeine 100-10 MG/5ML syrup Take 5 mLs by mouth every 6 (six) hours as needed for cough.     meloxicam (MOBIC) 7.5 MG tablet Take 7.5 mg by mouth daily as needed (pain.). (Patient not taking: Reported on 08/04/2023)     Multiple Vitamin (MULTIVITAMIN WITH MINERALS) TABS tablet Take 1 tablet by mouth daily. One A Day for Men (Patient not taking: Reported on 08/04/2023)     Omega-3 Fatty Acids (FISH OIL PO) Take 1 capsule by mouth 2 (two) times a week. (Patient not taking: Reported on 08/04/2023)     rosuvastatin (CRESTOR) 40 MG tablet TAKE 1 TABLET BY MOUTH DAILY 90 tablet 3    Physical Exam: Blood pressure (!) 160/103, pulse 94, temperature 98.7 F (37.1 C), resp. rate 16, height 5\' 11"  (1.803 m), weight 104.3 kg, SpO2 96%. Gen: male, NAD Abd: umbilical bulge with moderate overlying erythema, non tender to palpation  No results found for this or any previous visit (from the past 48 hours). No results found.  Assessment/Plan 68 y/o M who presents for elective umbilical hernia repair  - Will proceed to the OR. We discussed the alternatives and potential risks of surgery, including but not limited to: bleeding, infection, damage to bowel or surrounding structures,  mesh complications, chronic pain, hernia recurrence, and need for additional procedures. All questions were addressed and consent was obtained.    [facility] 08/10/2023, 2:19 PM Please see Amion for pager number during day hours 7:00am-4:30pm or 7:00am -11:30am on weekends

## 2023-08-10 NOTE — Anesthesia Postprocedure Evaluation (Signed)
 Anesthesia Post Note  Patient: Christopher Whitaker  Procedure(s) Performed: XI ROBOT ASSISTED UMBILICAL HERNIA REPAIR WITH MESH     Patient location during evaluation: PACU Anesthesia Type: General Level of consciousness: awake and alert and oriented Pain management: pain level controlled Vital Signs Assessment: post-procedure vital signs reviewed and stable Respiratory status: spontaneous breathing, nonlabored ventilation and respiratory function stable Cardiovascular status: blood pressure returned to baseline and stable Postop Assessment: no apparent nausea or vomiting Anesthetic complications: no   No notable events documented.  Last Vitals:  Vitals:   08/10/23 1845 08/10/23 1900  BP: (!) 139/90 137/89  Pulse: 73 69  Resp: 16 15  Temp: 36.5 C   SpO2: 100% 100%    Last Pain:  Vitals:   08/10/23 1900  PainSc: Asleep                 Kailo Kosik A.

## 2023-09-14 DIAGNOSIS — Z09 Encounter for follow-up examination after completed treatment for conditions other than malignant neoplasm: Secondary | ICD-10-CM | POA: Diagnosis not present

## 2023-10-29 DIAGNOSIS — M25561 Pain in right knee: Secondary | ICD-10-CM | POA: Diagnosis not present

## 2023-10-29 DIAGNOSIS — M25562 Pain in left knee: Secondary | ICD-10-CM | POA: Diagnosis not present

## 2024-02-05 DIAGNOSIS — L578 Other skin changes due to chronic exposure to nonionizing radiation: Secondary | ICD-10-CM | POA: Diagnosis not present

## 2024-02-05 DIAGNOSIS — Z0000 Encounter for general adult medical examination without abnormal findings: Secondary | ICD-10-CM | POA: Diagnosis not present

## 2024-02-05 DIAGNOSIS — I2510 Atherosclerotic heart disease of native coronary artery without angina pectoris: Secondary | ICD-10-CM | POA: Diagnosis not present

## 2024-02-05 DIAGNOSIS — B351 Tinea unguium: Secondary | ICD-10-CM | POA: Diagnosis not present

## 2024-02-09 DIAGNOSIS — I2510 Atherosclerotic heart disease of native coronary artery without angina pectoris: Secondary | ICD-10-CM | POA: Diagnosis not present

## 2024-03-03 DIAGNOSIS — I2510 Atherosclerotic heart disease of native coronary artery without angina pectoris: Secondary | ICD-10-CM | POA: Diagnosis not present

## 2024-03-04 DIAGNOSIS — I2510 Atherosclerotic heart disease of native coronary artery without angina pectoris: Secondary | ICD-10-CM | POA: Diagnosis not present

## 2024-03-04 DIAGNOSIS — B351 Tinea unguium: Secondary | ICD-10-CM | POA: Diagnosis not present
